# Patient Record
Sex: Female | Born: 1965 | Race: White | Hispanic: No | Marital: Married | Smoking: Never smoker
Health system: Southern US, Community
[De-identification: ages and names within clinical notes are randomized; demographics above are authoritative.]

## PROBLEM LIST (undated history)

## (undated) DIAGNOSIS — R42 Dizziness and giddiness: Secondary | ICD-10-CM

## (undated) DIAGNOSIS — I10 Essential (primary) hypertension: Secondary | ICD-10-CM

## (undated) HISTORY — PX: COLONOSCOPY: SHX174

## (undated) HISTORY — DX: Essential (primary) hypertension: I10

## (undated) HISTORY — DX: Dizziness and giddiness: R42

## (undated) HISTORY — PX: HYSTEROSCOPY: SHX211

---

## 1997-08-23 ENCOUNTER — Other Ambulatory Visit: Admission: RE | Admit: 1997-08-23 | Discharge: 1997-08-23 | Payer: Self-pay | Admitting: Obstetrics and Gynecology

## 1999-08-25 ENCOUNTER — Other Ambulatory Visit: Admission: RE | Admit: 1999-08-25 | Discharge: 1999-08-25 | Payer: Self-pay | Admitting: Obstetrics and Gynecology

## 2000-02-26 ENCOUNTER — Other Ambulatory Visit: Admission: RE | Admit: 2000-02-26 | Discharge: 2000-02-26 | Payer: Self-pay | Admitting: Obstetrics and Gynecology

## 2000-07-13 ENCOUNTER — Encounter: Admission: RE | Admit: 2000-07-13 | Discharge: 2000-07-13 | Payer: Self-pay | Admitting: Obstetrics and Gynecology

## 2000-09-24 ENCOUNTER — Inpatient Hospital Stay (HOSPITAL_COMMUNITY): Admission: AD | Admit: 2000-09-24 | Discharge: 2000-09-27 | Payer: Self-pay | Admitting: Obstetrics and Gynecology

## 2000-11-02 ENCOUNTER — Other Ambulatory Visit: Admission: RE | Admit: 2000-11-02 | Discharge: 2000-11-02 | Payer: Self-pay | Admitting: Obstetrics and Gynecology

## 2002-07-24 ENCOUNTER — Other Ambulatory Visit: Admission: RE | Admit: 2002-07-24 | Discharge: 2002-07-24 | Payer: Self-pay | Admitting: Obstetrics and Gynecology

## 2003-02-08 ENCOUNTER — Inpatient Hospital Stay (HOSPITAL_COMMUNITY): Admission: AD | Admit: 2003-02-08 | Discharge: 2003-02-11 | Payer: Self-pay | Admitting: Obstetrics and Gynecology

## 2003-03-20 ENCOUNTER — Other Ambulatory Visit: Admission: RE | Admit: 2003-03-20 | Discharge: 2003-03-20 | Payer: Self-pay | Admitting: Obstetrics and Gynecology

## 2004-09-23 ENCOUNTER — Other Ambulatory Visit: Admission: RE | Admit: 2004-09-23 | Discharge: 2004-09-23 | Payer: Self-pay | Admitting: Obstetrics and Gynecology

## 2011-03-03 ENCOUNTER — Emergency Department (HOSPITAL_COMMUNITY)
Admission: EM | Admit: 2011-03-03 | Discharge: 2011-03-03 | Disposition: A | Discharge status: Home / Self Care | Payer: BC Managed Care – PPO | Attending: Emergency Medicine | Admitting: Emergency Medicine

## 2011-03-03 ENCOUNTER — Emergency Department (HOSPITAL_COMMUNITY): Payer: BC Managed Care – PPO

## 2011-03-03 ENCOUNTER — Encounter (HOSPITAL_COMMUNITY): Payer: Self-pay | Admitting: Emergency Medicine

## 2011-03-03 DIAGNOSIS — S93402A Sprain of unspecified ligament of left ankle, initial encounter: Secondary | ICD-10-CM

## 2011-03-03 DIAGNOSIS — M25473 Effusion, unspecified ankle: Secondary | ICD-10-CM | POA: Insufficient documentation

## 2011-03-03 DIAGNOSIS — R269 Unspecified abnormalities of gait and mobility: Secondary | ICD-10-CM | POA: Insufficient documentation

## 2011-03-03 DIAGNOSIS — M25476 Effusion, unspecified foot: Secondary | ICD-10-CM | POA: Insufficient documentation

## 2011-03-03 DIAGNOSIS — S9000XA Contusion of unspecified ankle, initial encounter: Secondary | ICD-10-CM | POA: Insufficient documentation

## 2011-03-03 DIAGNOSIS — M25579 Pain in unspecified ankle and joints of unspecified foot: Secondary | ICD-10-CM | POA: Insufficient documentation

## 2011-03-03 DIAGNOSIS — S93409A Sprain of unspecified ligament of unspecified ankle, initial encounter: Secondary | ICD-10-CM | POA: Insufficient documentation

## 2011-03-03 DIAGNOSIS — X500XXA Overexertion from strenuous movement or load, initial encounter: Secondary | ICD-10-CM | POA: Insufficient documentation

## 2011-03-03 MED ORDER — IBUPROFEN 600 MG PO TABS: 600.0000 mg | ORAL_TABLET | Freq: Four times a day (QID) | ORAL | Status: AC | PRN

## 2011-03-03 MED ORDER — HYDROCODONE-ACETAMINOPHEN 5-325 MG PO TABS: 1.0000 | ORAL_TABLET | ORAL | Status: AC | PRN

## 2011-03-03 MED ORDER — IBUPROFEN 200 MG PO TABS
400.0000 mg | ORAL_TABLET | Freq: Once | ORAL | Status: AC
  Administered 2011-03-03: 400 mg via ORAL
  Filled 2011-03-03: qty 2

## 2011-03-03 MED ORDER — HYDROCODONE-ACETAMINOPHEN 5-325 MG PO TABS
1.0000 | ORAL_TABLET | Freq: Once | ORAL | Status: AC
  Administered 2011-03-03: 1 via ORAL
  Filled 2011-03-03: qty 1

## 2011-03-03 NOTE — ED Provider Notes (Signed)
History     CSN: [id number]  Arrival date & time 03/03/11  4782   First MD Initiated Contact with Patient 03/03/11 [phone]      Chief Complaint  Patient presents with  . Ankle Pain    L ankle pain secondary to fall    (Consider location/radiation/quality/duration/timing/severity/associated sxs/prior treatment) Patient is a 46 y.o. female presenting with ankle pain. The history is provided by the patient.  Ankle Pain  Incident onset: 7:30 AM. The incident occurred at home. Injury mechanism: Missed the step and had an inversion injury of the left ankle. Pain location: Ankle. The quality of the pain is described as aching. The pain is mild. The pain has been improving since onset. Associated symptoms include inability to bear weight and loss of motion. Pertinent negatives include no numbness, no muscle weakness, no loss of sensation and no tingling. The symptoms are aggravated by palpation and bearing weight. She has tried NSAIDs, elevation, immobilization and ice for the symptoms. The treatment provided moderate relief.    No past medical history on file.  No past surgical history on file.  No family history on file.  History  Substance Use Topics  . Smoking status: Never Smoker   . Smokeless tobacco: Not on file  . Alcohol Use: No    Review of Systems  Constitutional: Negative for fever and chills.  Musculoskeletal: Positive for joint swelling and gait problem.  Skin: Negative for color change and wound.  Neurological: Negative for tingling, weakness and numbness.    Allergies  Review of patient's allergies indicates no known allergies.  Home Medications   Current Outpatient Rx  Name Route Sig Dispense Refill  . CALCIUM CARBONATE 1250 MG PO CHEW Oral Chew 2 tablets by mouth daily. STOMACH DISTRESS    . IBUPROFEN 200 MG PO TABS Oral Take 400 mg by mouth every 6 (six) hours as needed. FOR PAIN    . METRONIDAZOLE 0.75 % EX GEL Topical Apply 1 application topically 2 (two)  times daily. FOR ROSACEA    . ADULT MULTIVITAMIN W/MINERALS CH Oral Take 1 tablet by mouth daily.      BP 136/78  Pulse 84  Temp(Src) 98.2 F (36.8 C) (Oral)  Resp 20  Ht 5\' 6"  (1.676 m)  Wt 175 lb (79.379 kg)  BMI 28.25 kg/m2  SpO2 99%  Physical Exam  Nursing note and vitals reviewed. Constitutional: She is oriented to person, place, and time. She appears well-developed and well-nourished. No distress.  HENT:  Head: Normocephalic and atraumatic.  Right Ear: External ear normal.  Left Ear: External ear normal.  Neck: Neck supple.  Cardiovascular: Normal rate and regular rhythm.   Pulmonary/Chest: Effort normal. No respiratory distress.  Abdominal: Soft. She exhibits no distension.  Musculoskeletal:       Left ankle: She exhibits decreased range of motion, swelling and ecchymosis. She exhibits no deformity, no laceration and normal pulse. tenderness. AITFL and CF ligament tenderness found. No proximal fibula tenderness found.       Feet:       Slightly decreased plantar flexion. Dorsi/plantar flexion strength 5/5 bilaterally  Neurological: She is alert and oriented to person, place, and time.       Sensation intact to light touch  Skin: Skin is warm and dry.    ED Course  Procedures (including critical care time)  Labs Reviewed - No data to display Dg Ankle Complete Left  03/03/2011  *RADIOLOGY REPORT*  Clinical Data: Pain post fall  LEFT ANKLE  COMPLETE - 3+ VIEW  Comparison: None.  Findings: Three views of the left ankle submitted.  No acute fracture or subluxation.  Ankle mortise is preserved.  Soft tissue swelling noted adjacent to lateral malleolus.  There is plantar spur of the calcaneus.  IMPRESSION: No acute fracture or subluxation.  Soft tissue swelling adjacent to lateral malleolus.  Plantar spur of the calcaneus.  Original Report Authenticated By: Natasha Mead, M.D.        MDM  X-ray reviewed, no sign of fx or dislocation. Distal neurovascular status intact.  Ankle sprain- ASO and crutches ordered. Pt given instructions on R.I.C.E. and gentle stretching to be performed in a few days.         Shaaron Adler, [place] 03/03/11 1215  Shaaron Adler, [place] 03/03/11 1218

## 2011-03-03 NOTE — Progress Notes (Signed)
Orthopedic Tech Progress Note Patient Details:  Whitney Gutierrez [birth year] [id number]  Other Ortho Devices Type of Ortho Device: ASO;Crutches   Cammer, Mickie Bail 03/03/2011, 12:05 PM

## 2011-03-03 NOTE — ED Notes (Signed)
Patient resting quietly, calm with unlabored respirations.  Husband at bedside.

## 2011-03-03 NOTE — ED Notes (Signed)
Patient asked for pain medication.  Huntley Dec, PA.

## 2011-03-03 NOTE — ED Notes (Signed)
Patient returned from radiology by stretcher with tech.

## 2011-03-03 NOTE — ED Notes (Signed)
Wheelchair to discharge.

## 2011-03-03 NOTE — ED Notes (Addendum)
Patient resting quietly, calm with unlabored respirations.  

## 2011-03-03 NOTE — ED Notes (Signed)
Patient was getting ready to take son to school when she tripped down some steps.  Patient injured her L ankle.  Ankle has redness and swelling per EMS, but no deformity.  Per EMS, patient took 2 x Ibuprofen at her residence PTA.

## 2011-03-03 NOTE — ED Notes (Signed)
Patient taken to radiology by stretcher with tech.  

## 2011-03-03 NOTE — ED Notes (Signed)
Ortho tech at the bedside placing splint and performing teaching regarding use of crutches. Pt tolerating without difficulty.

## 2011-03-06 NOTE — ED Provider Notes (Signed)
History/physical exam/procedure(s) were performed by non-physician practitioner and as supervising physician I was immediately available for consultation/collaboration. I have reviewed all notes and am in agreement with care and plan.   Hilario Quarry, MD 03/06/11 [phone]

## 2013-07-28 IMAGING — CR DG ANKLE COMPLETE 3+V*L*
3 series · 3 of 3 positions shown · non-contrast
Comparison: None.

CLINICAL DATA: Pain post fall

LEFT ANKLE COMPLETE - 3+ VIEW

[t ankle joint ap left]
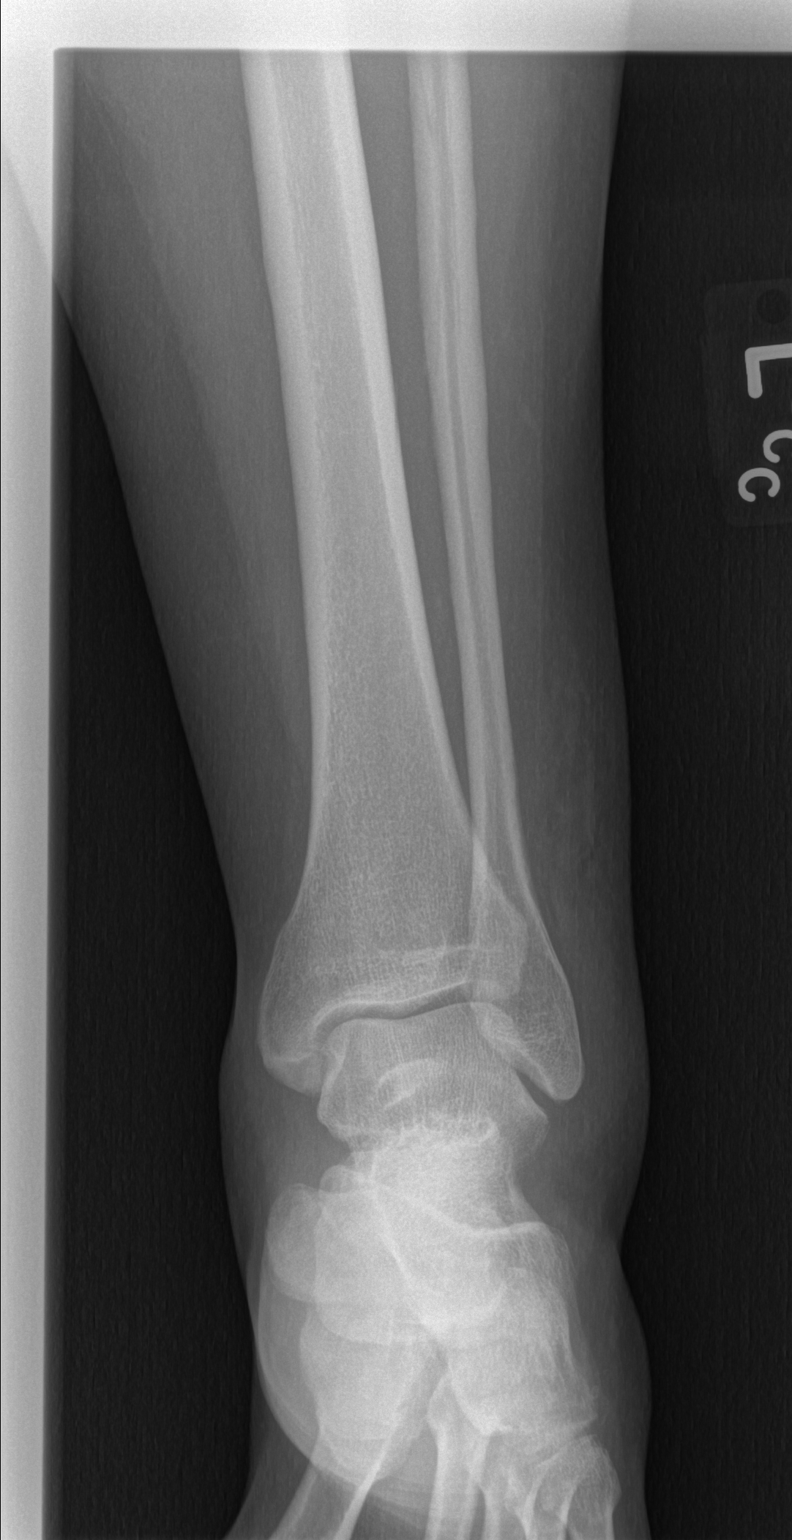

[t ankle joint oblique left]
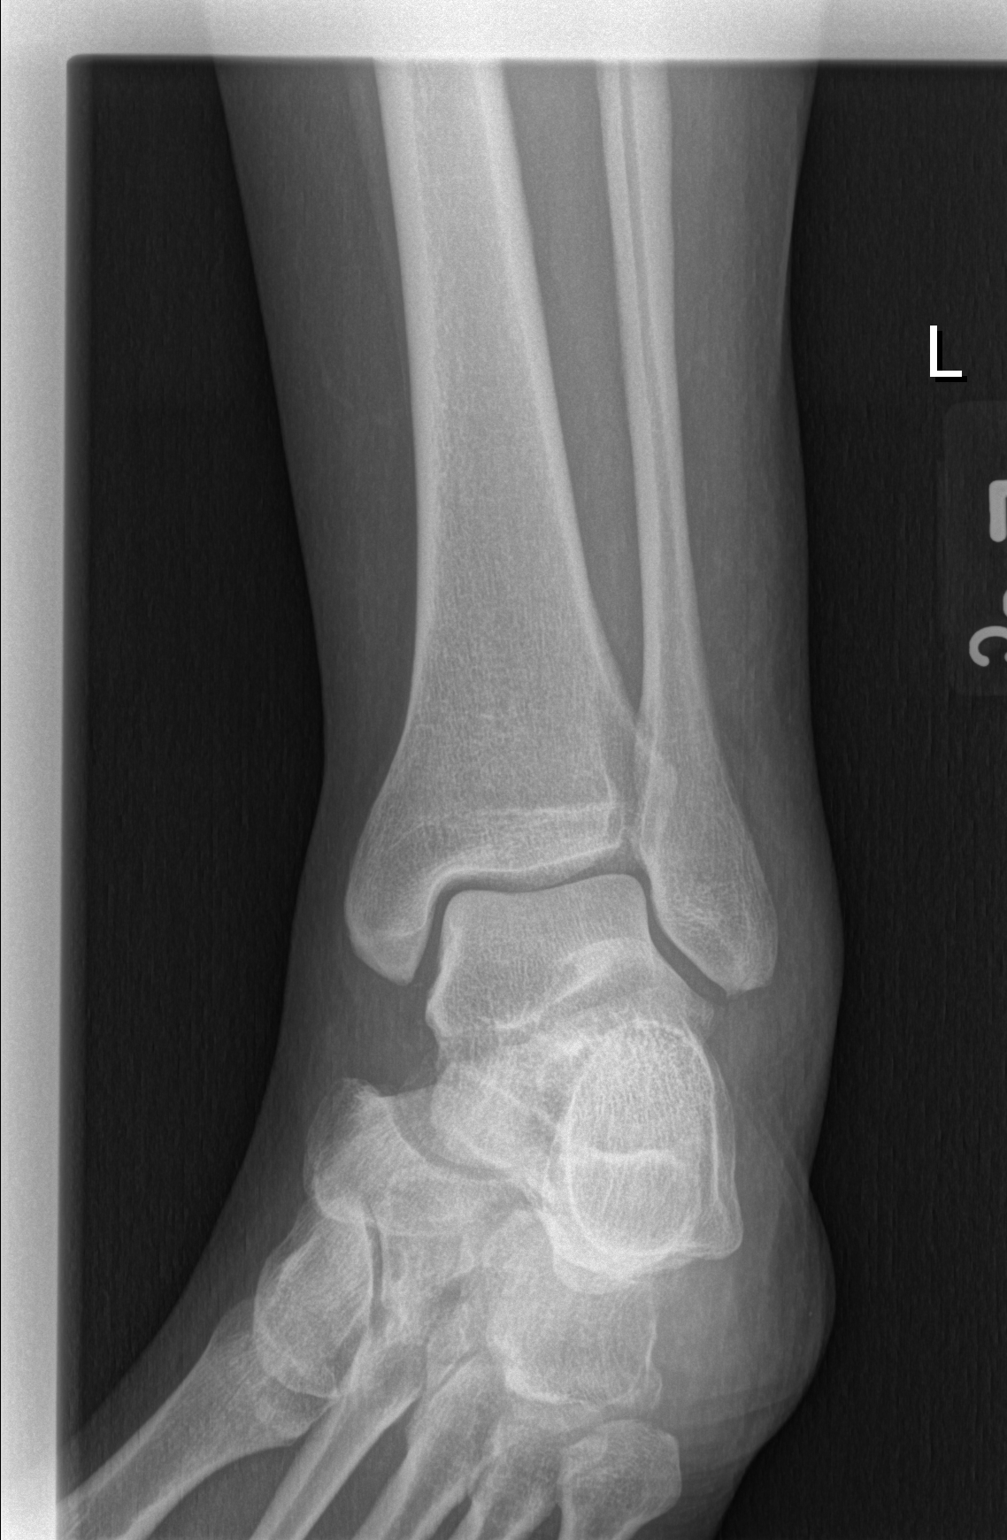

[t ankle joint lat left]
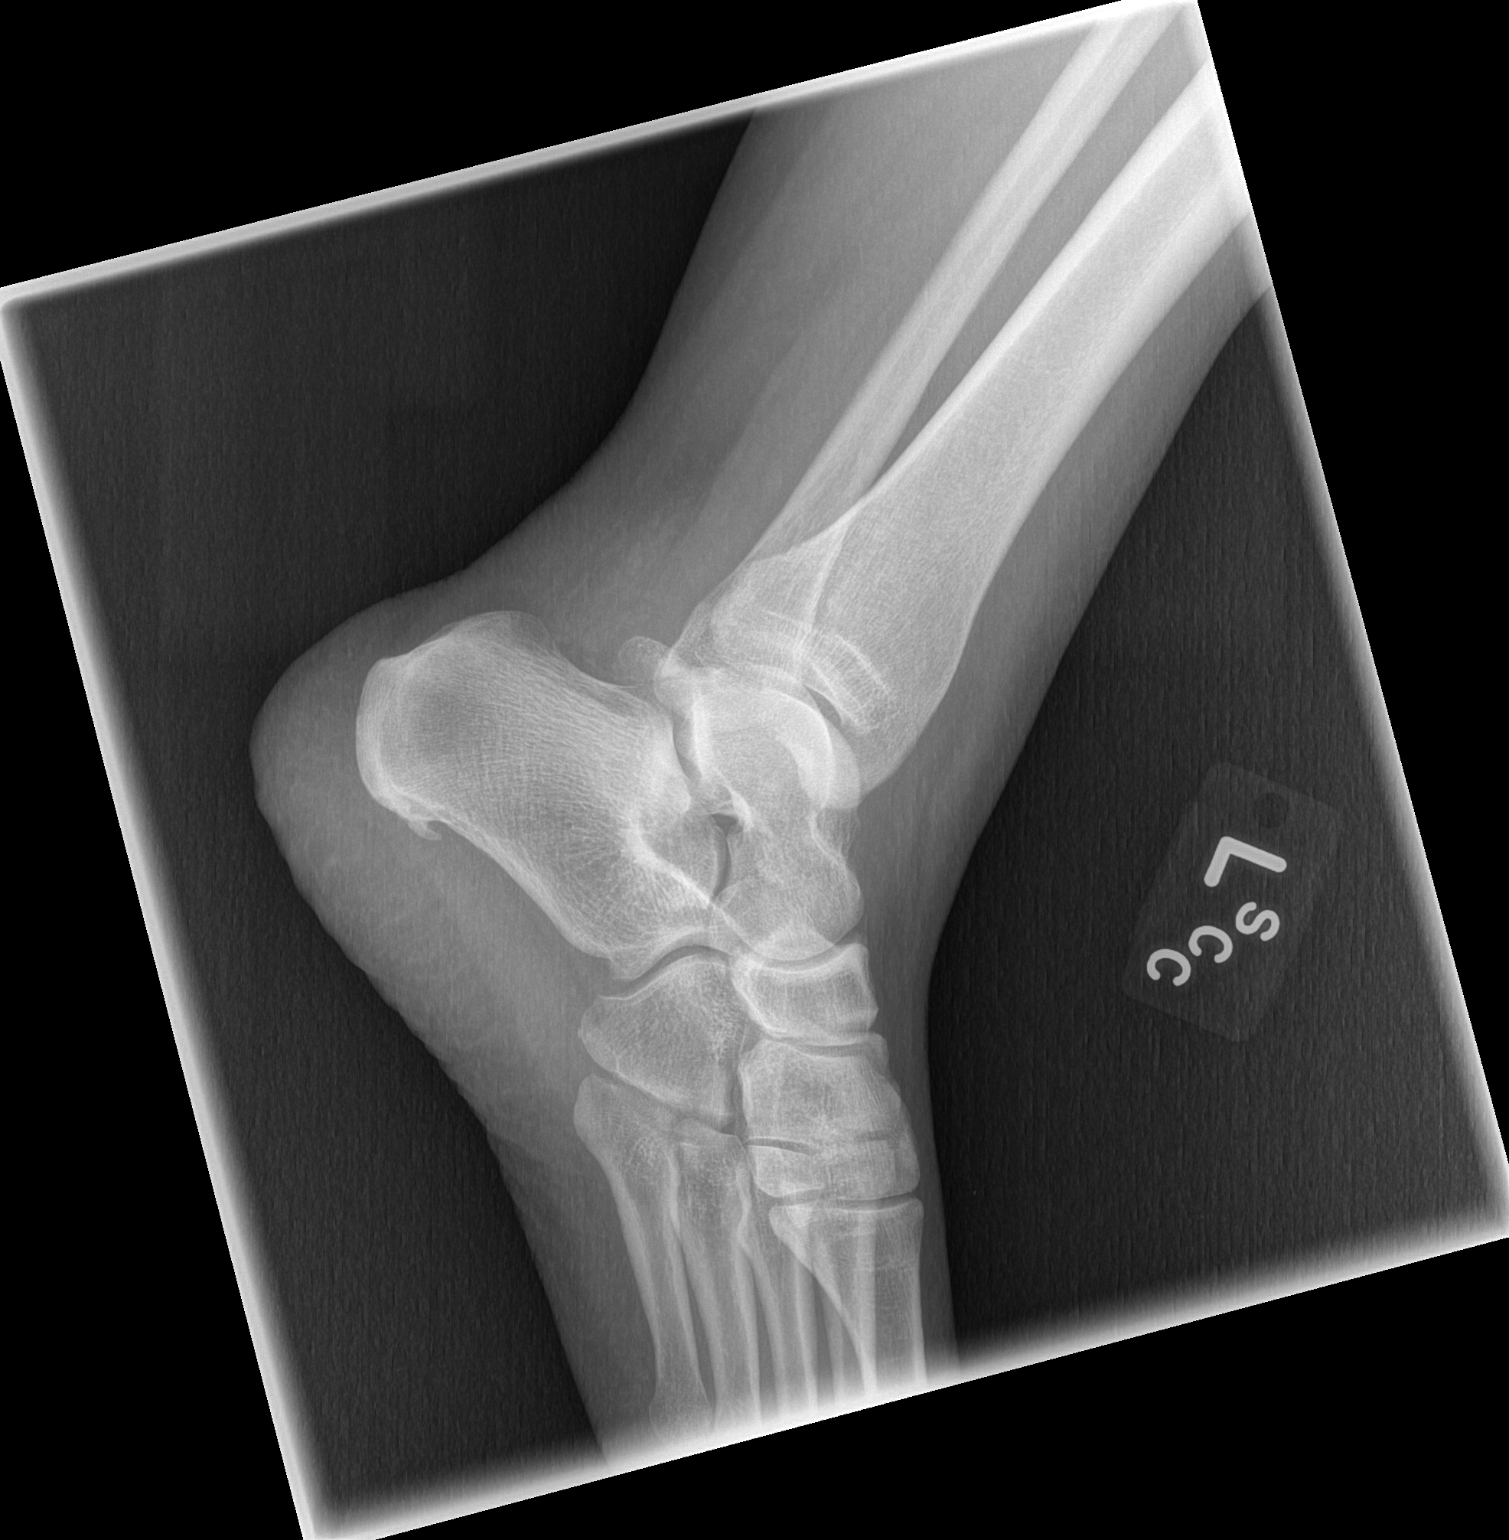

[3 of 3 positions shown; findings below may reference images not displayed]

FINDINGS: Three views of the left ankle submitted.  No acute
fracture or subluxation.  Ankle mortise is preserved.  Soft tissue
swelling noted adjacent to lateral malleolus.  There is plantar
spur of the calcaneus.
IMPRESSION: No acute fracture or subluxation.  Soft tissue swelling adjacent to
lateral malleolus.  Plantar spur of the calcaneus.

## 2015-11-08 ENCOUNTER — Encounter: Payer: Self-pay | Admitting: Gastroenterology

## 2015-12-31 ENCOUNTER — Encounter: Payer: Self-pay | Admitting: Obstetrics and Gynecology

## 2016-01-24 ENCOUNTER — Encounter: Payer: BC Managed Care – PPO | Admitting: Gastroenterology

## 2019-04-13 ENCOUNTER — Ambulatory Visit: Payer: BC Managed Care – PPO | Attending: Internal Medicine

## 2019-04-13 DIAGNOSIS — Z23 Encounter for immunization: Secondary | ICD-10-CM

## 2019-04-13 NOTE — Progress Notes (Signed)
   Covid-19 Vaccination Clinic  Name:  Whitney Gutierrez    MRN: [id number] DOB: 1965/06/25  04/13/2019  [name] was observed post Covid-19 immunization for 15 minutes without incidence. She was provided with Vaccine Information Sheet and instruction to access the V-Safe system.   Whitney Gutierrez was instructed to call 911 with any severe reactions post vaccine: Marland Kitchen Difficulty breathing  . Swelling of your face and throat  . A fast heartbeat  . A bad rash all over your body  . Dizziness and weakness    Immunizations Administered    Name Date Dose VIS Date Route   Pfizer COVID-19 Vaccine 04/13/2019 11:30 AM 0.3 mL 01/27/2019 Intramuscular   Manufacturer: [place]   Lot: [id number]   [place]: [id number]

## 2019-05-10 ENCOUNTER — Ambulatory Visit: Payer: BC Managed Care – PPO | Attending: Internal Medicine

## 2019-05-10 DIAGNOSIS — Z23 Encounter for immunization: Secondary | ICD-10-CM

## 2019-05-10 NOTE — Progress Notes (Signed)
   Covid-19 Vaccination Clinic  Name:  Whitney Gutierrez    MRN: [id number] DOB: October 03, 1965  05/10/2019  [name] was observed post Covid-19 immunization for 15 minutes without incident. She was provided with Vaccine Information Sheet and instruction to access the V-Safe system.   Ms. Trabue was instructed to call 911 with any severe reactions post vaccine: Marland Kitchen Difficulty breathing  . Swelling of face and throat  . A fast heartbeat  . A bad rash all over body  . Dizziness and weakness   Immunizations Administered    Name Date Dose VIS Date Route   Pfizer COVID-19 Vaccine 05/10/2019 12:34 PM 0.3 mL 01/27/2019 Intramuscular   Manufacturer: [place]   Lot: [id number]   [place]: [id number]

## 2019-08-14 ENCOUNTER — Encounter: Payer: Self-pay | Admitting: Gastroenterology

## 2019-10-02 ENCOUNTER — Encounter: Payer: Self-pay | Admitting: Gastroenterology

## 2019-10-02 ENCOUNTER — Ambulatory Visit (AMBULATORY_SURGERY_CENTER): Payer: Self-pay

## 2019-10-02 ENCOUNTER — Other Ambulatory Visit: Payer: Self-pay

## 2019-10-02 VITALS — Ht 66.0 in | Wt 196.0 lb

## 2019-10-02 DIAGNOSIS — Z1211 Encounter for screening for malignant neoplasm of colon: Secondary | ICD-10-CM

## 2019-10-02 MED ORDER — SUTAB 1479-225-188 MG PO TABS: 12.0000 | ORAL_TABLET | ORAL | 0 refills | Status: DC

## 2019-10-02 NOTE — Progress Notes (Signed)
No egg or soy allergy known to patient  No issues with past sedation with any surgeries or procedures no intubation problems in the past  No FH of Malignant Hyperthermia No diet pills per patient No home 02 use per patient  No blood thinners per patient  Pt denies issues with constipation  No A fib or A flutter  EMMI video to pt or via [place] 19 guidelines implemented in PV today with Pt and RN   SuTab Coupon given to pt in PV today , Code to Pharmacy   Pt has been vaccinated for covid.  Due to the COVID-19 pandemic we are asking patients to follow these guidelines. Please only bring one care partner. Please be aware that your care partner may wait in the car in the parking lot or if they feel like they will be too hot to wait in the car, they may wait in the lobby on the 4th floor. All care partners are required to wear a mask the entire time (we do not have any that we can provide them), they need to practice social distancing, and we will do a Covid check for all patient's and care partners when you arrive. Also we will check their temperature and your temperature. If the care partner waits in their car they need to stay in the parking lot the entire time and we will call them on their cell phone when the patient is ready for discharge so they can bring the car to the front of the building. Also all patient's will need to wear a mask into building.

## 2019-10-16 ENCOUNTER — Other Ambulatory Visit: Payer: Self-pay

## 2019-10-16 ENCOUNTER — Encounter: Payer: Self-pay | Admitting: Gastroenterology

## 2019-10-16 ENCOUNTER — Ambulatory Visit (AMBULATORY_SURGERY_CENTER): Payer: BC Managed Care – PPO | Admitting: Gastroenterology

## 2019-10-16 VITALS — BP 129/60 | HR 72 | Temp 97.5°F | Resp 14 | Ht 66.0 in | Wt 196.0 lb

## 2019-10-16 DIAGNOSIS — D124 Benign neoplasm of descending colon: Secondary | ICD-10-CM

## 2019-10-16 DIAGNOSIS — K635 Polyp of colon: Secondary | ICD-10-CM

## 2019-10-16 DIAGNOSIS — Z1211 Encounter for screening for malignant neoplasm of colon: Secondary | ICD-10-CM | POA: Diagnosis not present

## 2019-10-16 DIAGNOSIS — D127 Benign neoplasm of rectosigmoid junction: Secondary | ICD-10-CM

## 2019-10-16 MED ORDER — SODIUM CHLORIDE 0.9 % IV SOLN: 500.0000 mL | Freq: Once | INTRAVENOUS | Status: DC

## 2019-10-16 NOTE — Patient Instructions (Signed)
Handouts on polyps & diverticulosis given to you today   Await pathology results on polyps removed today   YOU HAD AN ENDOSCOPIC PROCEDURE TODAY AT [facility]:   Refer to the procedure report that was given to you for any specific questions about what was found during the examination.  If the procedure report does not answer your questions, please call your gastroenterologist to clarify.  If you requested that your care partner not be given the details of your procedure findings, then the procedure report has been included in a sealed envelope for you to review at your convenience later.  YOU SHOULD EXPECT: Some feelings of bloating in the abdomen. Passage of more gas than usual.  Walking can help get rid of the air that was put into your GI tract during the procedure and reduce the bloating. If you had a lower endoscopy (such as a colonoscopy or flexible sigmoidoscopy) you may notice spotting of blood in your stool or on the toilet paper. If you underwent a bowel prep for your procedure, you may not have a normal bowel movement for a few days.  Please Note:  You might notice some irritation and congestion in your nose or some drainage.  This is from the oxygen used during your procedure.  There is no need for concern and it should clear up in a day or so.  SYMPTOMS TO REPORT IMMEDIATELY:   Following lower endoscopy (colonoscopy or flexible sigmoidoscopy):  Excessive amounts of blood in the stool  Significant tenderness or worsening of abdominal pains  Swelling of the abdomen that is new, acute  Fever of 100F or higher    For urgent or emergent issues, a gastroenterologist can be reached at any hour by calling [phone]. Do not use MyChart messaging for urgent concerns.    DIET:  We do recommend a small meal at first, but then you may proceed to your regular diet.  Drink plenty of fluids but you should avoid alcoholic beverages for 24 hours.  ACTIVITY:  You  should plan to take it easy for the rest of today and you should NOT DRIVE or use heavy machinery until tomorrow (because of the sedation medicines used during the test).    FOLLOW UP: Our staff will call the number listed on your records 48-72 hours following your procedure to check on you and address any questions or concerns that you may have regarding the information given to you following your procedure. If we do not reach you, we will leave a message.  We will attempt to reach you two times.  During this call, we will ask if you have developed any symptoms of COVID 19. If you develop any symptoms (ie: fever, flu-like symptoms, shortness of breath, cough etc.) before then, please call [phone].  If you test positive for Covid 19 in the 2 weeks post procedure, please call and report this information to [place].    If any biopsies were taken you will be contacted by phone or by letter within the next 1-3 weeks.  Please call us at [phone] if you have not heard about the biopsies in 3 weeks.    SIGNATURES/CONFIDENTIALITY: You and/or your care partner have signed paperwork which will be entered into your electronic medical record.  These signatures attest to the fact that that the information above on your After Visit Summary has been reviewed and is understood.  Full responsibility of the confidentiality of this discharge information lies with  you and/or your care-partner.

## 2019-10-16 NOTE — Op Note (Signed)
[facility] Patient Name: Whitney Gutierrez Procedure Date: 10/16/2019 2:30 PM MRN: [id number] Endoscopist: Remo Lipps P. Havery Moros , MD Age: 54 Referring MD:  Date of Birth: 01/05/1966 Gender: Female Account #: [id number] Procedure:                Colonoscopy Indications:              Screening for colorectal malignant neoplasm, This                            is the patient's first colonoscopy Medicines:                Monitored Anesthesia Care Procedure:                Pre-Anesthesia Assessment:                           - Prior to the procedure, a History and Physical                            was performed, and patient medications and                            allergies were reviewed. The patient's tolerance of                            previous anesthesia was also reviewed. The risks                            and benefits of the procedure and the sedation                            options and risks were discussed with the patient.                            All questions were answered, and informed consent                            was obtained. Prior Anticoagulants: The patient has                            taken no previous anticoagulant or antiplatelet                            agents. ASA Grade Assessment: II - A patient with                            mild systemic disease. After reviewing the risks                            and benefits, the patient was deemed in                            satisfactory condition to undergo the procedure.  After obtaining informed consent, the colonoscope                            was passed under direct vision. Throughout the                            procedure, the patient's blood pressure, pulse, and                            oxygen saturations were monitored continuously. The                            Colonoscope was introduced through the anus and                            advanced to  the the cecum, identified by                            appendiceal orifice and ileocecal valve. The                            colonoscopy was performed without difficulty. The                            patient tolerated the procedure well. The quality                            of the bowel preparation was good. The ileocecal                            valve, appendiceal orifice, and rectum were                            photographed. Scope In: 2:41:18 PM Scope Out: 3:00:03 PM Scope Withdrawal Time: 0 hours 16 minutes 32 seconds  Total Procedure Duration: 0 hours 18 minutes 45 seconds  Findings:                 The perianal and digital rectal examinations were                            normal.                           A 3 mm polyp was found in the descending colon. The                            polyp was sessile. The polyp was removed with a                            cold snare. Resection and retrieval were complete.                           A 3 mm polyp was found in the recto-sigmoid colon.  The polyp was sessile. The polyp was removed with a                            cold snare. Resection and retrieval were complete.                           A few small-mouthed diverticula were found in the                            left colon.                           The exam was otherwise without abnormality. Complications:            No immediate complications. Estimated blood loss:                            Minimal. Estimated Blood Loss:     Estimated blood loss was minimal. Impression:               - One 3 mm polyp in the descending colon, removed                            with a cold snare. Resected and retrieved.                           - One 3 mm polyp at the recto-sigmoid colon,                            removed with a cold snare. Resected and retrieved.                           - Diverticulosis in the left colon.                           - The  examination was otherwise normal. Recommendation:           - Patient has a contact number available for                            emergencies. The signs and symptoms of potential                            delayed complications were discussed with the                            patient. Return to normal activities tomorrow.                            Written discharge instructions were provided to the                            patient.                           - Resume  previous diet.                           - Continue present medications.                           - Await pathology results. Remo Lipps P. Havery Moros, MD 10/16/2019 3:02:44 PM This report has been signed electronically.

## 2019-10-16 NOTE — Progress Notes (Signed)
Pt's states no medical or surgical changes since previsit or office visit.  ° °VS AG °

## 2019-10-16 NOTE — Progress Notes (Signed)
Called to room to assist during endoscopic procedure.  Patient ID and intended procedure confirmed with present staff. Received instructions for my participation in the procedure from the performing physician.  

## 2019-10-16 NOTE — Progress Notes (Signed)
To PACU, VSS. Report to [email or web address] 

## 2019-10-18 ENCOUNTER — Telehealth: Payer: Self-pay

## 2019-10-18 NOTE — Telephone Encounter (Signed)
  Follow up Call-  Call back number 10/16/2019  Post procedure Call Back phone  # [phone]  Permission to leave phone message Yes  Some recent data might be hidden     Patient questions:  Do you have a fever, pain , or abdominal swelling? No. Pain Score  0 *  Have you tolerated food without any problems? Yes.    Have you been able to return to your normal activities? Yes.    Do you have any questions about your discharge instructions: Diet   No. Medications  No. Follow up visit  No.  Do you have questions or concerns about your Care? No.  Actions: * If pain score is 4 or above: No action needed, pain <4.  1. Have you developed a fever since your procedure? No  2.   Have you had an respiratory symptoms (SOB or cough) since your procedure? No  3.   Have you tested positive for COVID 19 since your procedure No  4.   Have you had any family members/close contacts diagnosed with the COVID 19 since your procedure?  No  If yes to any of these questions please route to Joylene John, RN and Joella Prince, RN

## 2019-10-21 ENCOUNTER — Encounter: Payer: Self-pay | Admitting: Gastroenterology

## 2021-01-30 ENCOUNTER — Telehealth: Payer: Self-pay | Admitting: *Deleted

## 2021-01-30 NOTE — Telephone Encounter (Signed)
Patient called the office and scheduled her new patient appt with Dr Berline Lopes on 12/23 at 10:30 am; patient given an arrival time of 10 am. Patient aware of the clinic address and phone number.

## 2021-01-30 NOTE — Telephone Encounter (Signed)
Called and left the patient a message to call the office back to schedule a new patient appt with Dr Tucker  

## 2021-02-06 ENCOUNTER — Encounter: Payer: Self-pay | Admitting: Gynecologic Oncology

## 2021-02-07 ENCOUNTER — Inpatient Hospital Stay: Payer: BC Managed Care – PPO | Attending: Gynecologic Oncology | Admitting: Gynecologic Oncology

## 2021-02-07 ENCOUNTER — Encounter: Payer: Self-pay | Admitting: Gynecologic Oncology

## 2021-02-07 ENCOUNTER — Other Ambulatory Visit: Payer: Self-pay | Admitting: Gynecologic Oncology

## 2021-02-07 ENCOUNTER — Other Ambulatory Visit: Payer: Self-pay

## 2021-02-07 ENCOUNTER — Inpatient Hospital Stay (HOSPITAL_BASED_OUTPATIENT_CLINIC_OR_DEPARTMENT_OTHER): Payer: BC Managed Care – PPO | Admitting: Gynecologic Oncology

## 2021-02-07 VITALS — BP 152/88 | HR 97 | Temp 98.2°F | Resp 18 | Ht 66.0 in | Wt 195.7 lb

## 2021-02-07 DIAGNOSIS — N8502 Endometrial intraepithelial neoplasia [EIN]: Secondary | ICD-10-CM

## 2021-02-07 DIAGNOSIS — Z6831 Body mass index (BMI) 31.0-31.9, adult: Secondary | ICD-10-CM | POA: Insufficient documentation

## 2021-02-07 MED ORDER — TRAMADOL HCL 50 MG PO TABS: 50.0000 mg | ORAL_TABLET | Freq: Four times a day (QID) | ORAL | 0 refills | Status: DC | PRN

## 2021-02-07 MED ORDER — IBUPROFEN 800 MG PO TABS: 800.0000 mg | ORAL_TABLET | Freq: Three times a day (TID) | ORAL | 0 refills | Status: DC | PRN

## 2021-02-07 MED ORDER — TRAMADOL HCL 50 MG PO TABS: 50.0000 mg | ORAL_TABLET | Freq: Four times a day (QID) | ORAL | 0 refills | Status: AC | PRN

## 2021-02-07 MED ORDER — SENNOSIDES-DOCUSATE SODIUM 8.6-50 MG PO TABS: 2.0000 | ORAL_TABLET | Freq: Every day | ORAL | 0 refills | Status: DC

## 2021-02-07 MED ORDER — IBUPROFEN 800 MG PO TABS: 800.0000 mg | ORAL_TABLET | Freq: Three times a day (TID) | ORAL | 0 refills | Status: AC | PRN

## 2021-02-07 MED ORDER — SENNOSIDES-DOCUSATE SODIUM 8.6-50 MG PO TABS: 2.0000 | ORAL_TABLET | Freq: Every day | ORAL | 0 refills | Status: AC

## 2021-02-07 NOTE — H&P (View-Only) (Signed)
GYNECOLOGIC ONCOLOGY NEW PATIENT CONSULTATION   Patient Name: Whitney Gutierrez  Patient Age: 55 y.o. 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  We reviewed the diagnosis of complex atypical hyperplasia (CAH) and the treatment options, including medical management (Mirena IUD or progesterone PO) or hysterectomy.    Given her age and that she is done with childbirth and healthy, I recommend surgical management.  Patient desires to proceed with surgical management.    The patient is a suitable candidate for hysterectomy via a minimally invasive approach to surgery.  Given that she is peri versus postmenopausal, a bilateral salpingo-oophorectomy is also recommended.  We reviewed that robotic assistance would be used to complete the surgery.  We discussed that endometrial cancer is detected in about 40% of final uterine pathology specimens from patients with CAH.  We talked about 2 plans.  One would be the standard of care which would be to plan to send the uterus for intraoperative frozen pathology.  If cancer is identified at the time of surgery, additional procedures including lymph node evaluation, is recommended.  The other option would be to plan for sentinel lymph node biopsy.  We discussed that this is standard of care in the setting of a cancer diagnosis.  Because of the high risk of ultimately diagnosing a cancer on hysterectomy for complex atypical hyperplasia, I offered this lymph node sampling procedure to patients with a biopsy of CAH preoperatively.  We discussed that if she were not to map, then we would default to sending the uterus to pathology for frozen section to guide whether lymph node removal was necessary.  We reviewed the  sentinel lymph node technique. Risks and benefits of sentinel lymph node biopsy was reviewed. We reviewed the technique and ICG dye.  We discussed the plan the patient DOES NOT have an iodine allergy or known liver dysfunction. We reviewed the false negative rate (0.4%), and that 3% of patients with metastatic disease will not have it detected by SLN biopsy in endometrial cancer. A low risk of allergic reaction to the dye, <0.2% for ICG, has been reported. We also discussed that in the case of failed mapping, which occurs 40% of the time, a bilateral or unilateral lymphadenectomy will be performed at the surgeons discretion.   Potential benefits of sentinel nodes including a higher detection rate for metastasis due to ultrastaging and potential reduction in operative morbidity. However, there remains uncertainty as to the role for treatment of micrometastatic disease. Further, the benefit of operative morbidity associated with the SLN technique in endometrial cancer is not yet completely known. In other patient populations (e.g. the cervical cancer population) there has been observed reductions in morbidity with SLN biopsy compared to pelvic lymphadenectomy. Lymphedema, nerve dysfunction and lymphocysts are all potential risks with the SLN technique as with complete lymphadenectomy. Additional risks to the patient include the risk of damage to an internal organ while operating in an altered view (e.g. the black and white image of the robotic fluorescence imaging mode).   The patient was consented for a robotic assisted hysterectomy, bilateral salpingo-oophorectomy, sentinel lymph node evaluation, possible lymph node dissection, possible laparotomy. The risks of surgery were discussed in detail and she understands these to include infection; wound separation; hernia; vaginal cuff separation, injury to adjacent organs  such as bowel, bladder, blood vessels, ureters and nerves; bleeding which may require blood  transfusion; anesthesia risk; thromboembolic events; possible death; unforeseen complications; possible need for re-exploration; medical complications such as heart attack, stroke, pleural effusion and pneumonia; and, if full lymphadenectomy is performed the risk of lymphedema and lymphocyst. The patient will receive DVT and antibiotic prophylaxis as indicated. She voiced a clear understanding. She had the opportunity to ask questions. Perioperative instructions were reviewed with her. Prescriptions for post-op medications were sent to her pharmacy of choice.  We discussed the pathogenesis for endometrial precancer and cancer with regard to excess or unopposed estrogen exposure.  A copy of this note was sent to the patient's referring provider.   65 minutes of total time was spent for this patient encounter, including preparation, face-to-face counseling with the patient and coordination of care, and documentation of the encounter.   Jeral Pinch, MD  Division of Gynecologic Oncology  Department of Obstetrics and Gynecology  [facility]  ___________________________________________  Chief Complaint: Chief Complaint  Patient presents with   Complex atypical endometrial hyperplasia    History of Present Illness:  Whitney Gutierrez is a 55 y.o. y.o. female who is seen in consultation at the request of Everlene Farrier, MD for an evaluation of complex atypical hyperplasia.  Patient reports that last menses was in February 2022.  She had some minimal bleeding in July of this year although not enough to use a tampon.  She went to see her OB/GYN in September for checkup.  Given changes to her menses, hormones were ordered to see if she was menopausal.    Pelvic ultrasound was also ordered and this was performed on 10/10. It showed uterus measures 10 x 5.6 x 5 cm with slightly thickened inhomogenous myometrium, raising question about endometriosis.  Endometrial  thickness measures 1 cm.  Ovaries not well seen.  After saline sonohysterogram, 9 mm polypoid appearing mass seen within the endometrial cavity.  Endometrial curettings on 12/8 show complex atypical hyperplasia with squamous metaplasia.  There are few foci noted with back-to-back glandular crowding but not associated with stromal reaction.  Focal well differentiated adenocarcinoma cannot be ruled out, but changes noted to be predominantly complex atypical hyperplasia.  Patient denies any vaginal bleeding or discharge.  She denies any abdominal or pelvic pain.  She endorses good appetite without nausea or emesis.  She reports normal bowel and bladder function.  Has vertigo as well as whitecoat hypertension, denies any other medical issues.  Has never had abdominal surgery.  Family history is notable for father who was treated for stomach cancer 10 years ago [facility] at [facility].  Patient lives in [place] with her husband and 50 year old son.  Her 1 year old daughter is visiting from college.  Patient works as a Microbiologist.  PAST MEDICAL HISTORY:  Past Medical History:  Diagnosis Date   Vertigo    White coat syndrome with hypertension      PAST SURGICAL HISTORY:  Past Surgical History:  Procedure Laterality Date   COLONOSCOPY     HYSTEROSCOPY N/A     OB/GYN HISTORY:  OB History  Gravida Para Term Preterm AB Living  2 2          SAB IAB Ectopic Multiple Live Births               # Outcome Date GA Lbr Len/2nd Weight Sex Delivery Anes PTL Lv  2 Para  1 Para             No LMP recorded. (Menstrual status: Irregular Periods).  Age at menarche: 23 Age at menopause: 60 Hx of HRT: denies Hx of STDs: No Last pap: 10/2020-ASCUS, high risk HPV negative. History of abnormal pap smears: Noted above  SCREENING STUDIES:  Last mammogram: 10/2020  Last colonoscopy: 09/2019  MEDICATIONS: Outpatient Encounter Medications as of 02/07/2021   Medication Sig   ibuprofen (ADVIL,MOTRIN) 200 MG tablet Take 400 mg by mouth every 6 (six) hours as needed. FOR PAIN- as needed   Multiple Vitamin (MULITIVITAMIN WITH MINERALS) TABS Take 1 tablet by mouth daily.   No facility-administered encounter medications on file as of 02/07/2021.    ALLERGIES:  No Known Allergies   FAMILY HISTORY:  Family History  Problem Relation Age of Onset   Stomach cancer Father    Colon polyps Brother    Colon cancer Neg Hx    Esophageal cancer Neg Hx    Rectal cancer Neg Hx    Breast cancer Neg Hx    Ovarian cancer Neg Hx    Endometrial cancer Neg Hx    Pancreatic cancer Neg Hx    Prostate cancer Neg Hx      SOCIAL HISTORY:  Social Connections: Not on file    REVIEW OF SYSTEMS:  Denies appetite changes, fevers, chills, fatigue, unexplained weight changes. Denies hearing loss, neck lumps or masses, mouth sores, ringing in ears or voice changes. Denies cough or wheezing.  Denies shortness of breath. Denies chest pain or palpitations. Denies leg swelling. Denies abdominal distention, pain, blood in stools, constipation, diarrhea, nausea, vomiting, or early satiety. Denies pain with intercourse, dysuria, frequency, hematuria or incontinence. Denies hot flashes, pelvic pain, vaginal bleeding or vaginal discharge.   Denies joint pain, back pain or muscle pain/cramps. Denies itching, rash, or wounds. Denies dizziness, headaches, numbness or seizures. Denies swollen lymph nodes or glands, denies easy bruising or bleeding. Denies anxiety, depression, confusion, or decreased concentration.  Physical Exam:  Vital Signs for this encounter:  Blood pressure (!) 152/88, pulse 97, temperature 98.2 F (36.8 C), temperature source Tympanic, resp. rate 18, height 5\' 6"  (1.676 m), weight 195 lb 11.2 oz (88.8 kg), SpO2 99 %. Body mass index is 31.59 kg/m. General: Alert, oriented, no acute distress.  HEENT: Normocephalic, atraumatic. Sclera anicteric.   Chest: Clear to auscultation bilaterally. No wheezes, rhonchi, or rales. Cardiovascular: Regular rate and rhythm, no murmurs, rubs, or gallops.  Abdomen: Normoactive bowel sounds. Soft, nondistended, nontender to palpation. No masses or hepatosplenomegaly appreciated. No palpable fluid wave.  Extremities: Grossly normal range of motion. Warm, well perfused. No edema bilaterally.  Skin: No rashes or lesions.  Lymphatics: No cervical, supraclavicular, or inguinal adenopathy.  GU:  Normal external female genitalia. No lesions. No discharge or bleeding.             Bladder/urethra:  No lesions or masses, well supported bladder             Vagina: Well rugated, no lesions or masses.             Cervix: Normal appearing, no lesions.             Uterus: 8-10, mobile, no parametrial involvement or nodularity.             Adnexa: No masses appreciated.  Rectal: Deferred  LABORATORY AND RADIOLOGIC DATA:  Outside medical records were reviewed to synthesize the above history, along with the history  and physical obtained during the visit.   No results found for: WBC, HGB, HCT, PLT, GLUCOSE, CHOL, TRIG, HDL, LDLDIRECT, LDLCALC, ALT, AST, NA, K, CL, CREATININE, BUN, CO2, TSH, PSA, INR, GLUF, HGBA1C, MICROALBUR

## 2021-02-07 NOTE — Patient Instructions (Signed)
Preparing for your Surgery   Plan for surgery on February 25, 2021 with Dr. Jeral Pinch at [facility] will be scheduled for robotic assisted total laparoscopic hysterectomy (removal of the uterus and cervix), bilateral salpingo-oophorectomy (removal of both ovaries and fallopian tubes), sentinel lymph node biopsy, possible lymph node dissection, possible laparotomy (larger incision on your abdomen if needed).    Pre-operative Testing -You will receive a phone call from presurgical testing at [facility] to arrange for a pre-operative appointment and lab work.   -Bring your insurance card, copy of an advanced directive if applicable, medication list   -At that visit, you will be asked to sign a consent for a possible blood transfusion in case a transfusion becomes necessary during surgery.  The need for a blood transfusion is rare but having consent is a necessary part of your care.      -You should not be taking blood thinners or aspirin at least ten days prior to surgery unless instructed by your surgeon.   -Do not take supplements such as fish oil (omega 3), red yeast rice, turmeric before your surgery. You want to avoid medications with aspirin in them including headache powders such as BC or Goody's), Excedrin migraine.   Day Before Surgery at [place] will be asked to take in a light diet the day before surgery. You will be advised you can have clear liquids up until 3 hours before your surgery.     Eat a light diet the day before surgery.  Examples including soups, broths, toast, yogurt, mashed potatoes.  AVOID GAS PRODUCING FOODS. Things to avoid include carbonated beverages (fizzy beverages, sodas), raw fruits and raw vegetables (uncooked), or beans.    If your bowels are filled with gas, your surgeon will have difficulty visualizing your pelvic organs which increases your surgical risks.   Your role in recovery Your role is to become active as soon as  directed by your doctor, while still giving yourself time to heal.  Rest when you feel tired. You will be asked to do the following in order to speed your recovery:   - Cough and breathe deeply. This helps to clear and expand your lungs and can prevent pneumonia after surgery.  - [place]. Do mild physical activity. Walking or moving your legs help your circulation and body functions return to normal. Do not try to get up or walk alone the first time after surgery.   -If you develop swelling on one leg or the other, pain in the back of your leg, redness/warmth in one of your legs, please call the office or go to the Emergency Room to have a doppler to rule out a blood clot. For shortness of breath, chest pain-seek care in the Emergency Room as soon as possible. - Actively manage your pain. Managing your pain lets you move in comfort. We will ask you to rate your pain on a scale of zero to 10. It is your responsibility to tell your doctor or nurse where and how much you hurt so your pain can be treated.   Special Considerations -If you are diabetic, you may be placed on insulin after surgery to have closer control over your blood sugars to promote healing and recovery.  This does not mean that you will be discharged on insulin.  If applicable, your oral antidiabetics will be resumed when you are tolerating a solid diet.   -Your final pathology results from  surgery should be available around one week after surgery and the results will be relayed to you when available.   -Dr. Lahoma Crocker is the surgeon that assists your GYN Oncologist with surgery.  If you end up staying the night, the next day after your surgery you will either see Dr. Berline Lopes or Dr. Lahoma Crocker.   -FMLA forms can be faxed to [phone] and please allow 5-7 business days for completion.   Pain Management After Surgery -You have been prescribed your pain medication (tramadol) and bowel regimen  medications before surgery so that you can have these available when you are discharged from the hospital. The pain medication is for use ONLY AFTER surgery and a new prescription will not be given.    -Make sure that you have Tylenol and Ibuprofen at home to use on a regular basis after surgery for pain control. We recommend alternating the medications every hour to six hours since they work differently and are processed in the body differently for pain relief.   -Review the attached handout on narcotic use and their risks and side effects.    Bowel Regimen -You have been prescribed Sennakot-S to take nightly to prevent constipation especially if you are taking the narcotic pain medication intermittently.  It is important to prevent constipation and drink adequate amounts of liquids. You can stop taking this medication when you are not taking pain medication and you are back on your normal bowel routine.   Risks of Surgery Risks of surgery are low but include bleeding, infection, damage to surrounding structures, re-operation, blood clots, and very rarely death.     Blood Transfusion Information (For the consent to be signed before surgery)   We will be checking your blood type before surgery so in case of emergencies, we will know what type of blood you would need.                                             WHAT IS A BLOOD TRANSFUSION?   A transfusion is the replacement of blood or some of its parts. Blood is made up of multiple cells which provide different functions. Red blood cells carry oxygen and are used for blood loss replacement. White blood cells fight against infection. Platelets control bleeding. Plasma helps clot blood. Other blood products are available for specialized needs, such as hemophilia or other clotting disorders. BEFORE THE TRANSFUSION  Who gives blood for transfusions?  You may be able to donate blood to be used at a later date on yourself (autologous  donation). Relatives can be asked to donate blood. This is generally not any safer than if you have received blood from a stranger. The same precautions are taken to ensure safety when a relative's blood is donated. Healthy volunteers who are fully evaluated to make sure their blood is safe. This is blood bank blood. Transfusion therapy is the safest it has ever been in the practice of medicine. Before blood is taken from a donor, a complete history is taken to make sure that person has no history of diseases nor engages in risky social behavior (examples are intravenous drug use or sexual activity with multiple partners). The donor's travel history is screened to minimize risk of transmitting infections, such as malaria. The donated blood is tested for signs of infectious diseases, such as HIV and hepatitis. The blood is  then tested to be sure it is compatible with you in order to minimize the chance of a transfusion reaction. If you or a relative donates blood, this is often done in anticipation of surgery and is not appropriate for emergency situations. It takes many days to process the donated blood. RISKS AND COMPLICATIONS Although transfusion therapy is very safe and saves many lives, the main dangers of transfusion include:  Getting an infectious disease. Developing a transfusion reaction. This is an allergic reaction to something in the blood you were given. Every precaution is taken to prevent this. The decision to have a blood transfusion has been considered carefully by your caregiver before blood is given. Blood is not given unless the benefits outweigh the risks.   AFTER SURGERY INSTRUCTIONS   Return to work: 6 weeks if applicable   Activity: 1. Be up and out of the bed during the day.  Take a nap if needed.  You may walk up steps but be careful and use the hand rail.  Stair climbing will tire you more than you think, you may need to stop part way and rest.    2. No lifting or straining  for 6 weeks over 10 pounds. No pushing, pulling, straining for 6 weeks.   3. No driving for 1 week(s).  Do not drive if you are taking narcotic pain medicine and make sure that your reaction time has returned.    4. You can shower as soon as the next day after surgery. Shower daily.  Use your regular soap and water (not directly on the incision) and pat your incision(s) dry afterwards; don't rub.  No tub baths or submerging your body in water until cleared by your surgeon. If you have the soap that was given to you by pre-surgical testing that was used before surgery, you do not need to use it afterwards because this can irritate your incisions.    5. No sexual activity and nothing in the vagina for 8 weeks.   6. You may experience a small amount of clear drainage from your incisions, which is normal.  If the drainage persists, increases, or changes color please call the office.   7. Do not use creams, lotions, or ointments such as neosporin on your incisions after surgery until advised by your surgeon because they can cause removal of the dermabond glue on your incisions.     8. You may experience vaginal spotting after surgery or around the 6-8 week mark from surgery when the stitches at the top of the vagina begin to dissolve.  The spotting is normal but if you experience heavy bleeding, call our office.   9. Take Tylenol or ibuprofen first for pain and only use narcotic pain medication for severe pain not relieved by the Tylenol or Ibuprofen.  Monitor your Tylenol intake to a max of 4,000 mg in a 24 hour period. You can alternate these medications after surgery.   Diet: 1. Low sodium Heart Healthy Diet is recommended but you are cleared to resume your normal (before surgery) diet after your procedure.   2. It is safe to use a laxative, such as Miralax or Colace, if you have difficulty moving your bowels. You have been prescribed Sennakot-S to take at bedtime every evening after surgery to keep  bowel movements regular and to prevent constipation.     Wound Care: 1. Keep clean and dry.  Shower daily.   Reasons to call the Doctor: Fever - Oral temperature greater  than 100.4 degrees Fahrenheit Foul-smelling vaginal discharge Difficulty urinating Nausea and vomiting Increased pain at the site of the incision that is unrelieved with pain medicine. Difficulty breathing with or without chest pain New calf pain especially if only on one side Sudden, continuing increased vaginal bleeding with or without clots.   Contacts: For questions or concerns you should contact:   Dr. Jeral Pinch at [phone]   Joylene John, NP at [phone]   After Hours: call [phone] and have the GYN Oncologist paged/contacted (after 5 pm or on the weekends).   Messages sent via mychart are for non-urgent matters and are not responded to after hours so for urgent needs, please call the after hours number.

## 2021-02-07 NOTE — Patient Instructions (Addendum)
Preparing for your Surgery  Plan for surgery on February 25, 2021 with Dr. Jeral Pinch at [facility] will be scheduled for robotic assisted total laparoscopic hysterectomy (removal of the uterus and cervix), bilateral salpingo-oophorectomy (removal of both ovaries and fallopian tubes), sentinel lymph node biopsy, possible lymph node dissection, possible laparotomy (larger incision on your abdomen if needed).   Pre-operative Testing -You will receive a phone call from presurgical testing at [facility] to arrange for a pre-operative appointment and lab work.  -Bring your insurance card, copy of an advanced directive if applicable, medication list  -At that visit, you will be asked to sign a consent for a possible blood transfusion in case a transfusion becomes necessary during surgery.  The need for a blood transfusion is rare but having consent is a necessary part of your care.     -You should not be taking blood thinners or aspirin at least ten days prior to surgery unless instructed by your surgeon.  -Do not take supplements such as fish oil (omega 3), red yeast rice, turmeric before your surgery. You want to avoid medications with aspirin in them including headache powders such as BC or Goody's), Excedrin migraine.  Day Before Surgery at [place] will be asked to take in a light diet the day before surgery. You will be advised you can have clear liquids up until 3 hours before your surgery.    Eat a light diet the day before surgery.  Examples including soups, broths, toast, yogurt, mashed potatoes.  AVOID GAS PRODUCING FOODS. Things to avoid include carbonated beverages (fizzy beverages, sodas), raw fruits and raw vegetables (uncooked), or beans.   If your bowels are filled with gas, your surgeon will have difficulty visualizing your pelvic organs which increases your surgical risks.  Your role in recovery Your role is to become active as soon as directed by  your doctor, while still giving yourself time to heal.  Rest when you feel tired. You will be asked to do the following in order to speed your recovery:  - Cough and breathe deeply. This helps to clear and expand your lungs and can prevent pneumonia after surgery.  - [place]. Do mild physical activity. Walking or moving your legs help your circulation and body functions return to normal. Do not try to get up or walk alone the first time after surgery.   -If you develop swelling on one leg or the other, pain in the back of your leg, redness/warmth in one of your legs, please call the office or go to the Emergency Room to have a doppler to rule out a blood clot. For shortness of breath, chest pain-seek care in the Emergency Room as soon as possible. - Actively manage your pain. Managing your pain lets you move in comfort. We will ask you to rate your pain on a scale of zero to 10. It is your responsibility to tell your doctor or nurse where and how much you hurt so your pain can be treated.  Special Considerations -If you are diabetic, you may be placed on insulin after surgery to have closer control over your blood sugars to promote healing and recovery.  This does not mean that you will be discharged on insulin.  If applicable, your oral antidiabetics will be resumed when you are tolerating a solid diet.  -Your final pathology results from surgery should be available around one week after surgery and the results will  be relayed to you when available.  -Dr. Lahoma Crocker is the surgeon that assists your GYN Oncologist with surgery.  If you end up staying the night, the next day after your surgery you will either see Dr. Berline Lopes or Dr. Lahoma Crocker.  -FMLA forms can be faxed to [phone] and please allow 5-7 business days for completion.  Pain Management After Surgery -You have been prescribed your pain medication (tramadol) and bowel regimen medications before  surgery so that you can have these available when you are discharged from the hospital. The pain medication is for use ONLY AFTER surgery and a new prescription will not be given.   -Make sure that you have Tylenol and Ibuprofen at home to use on a regular basis after surgery for pain control. We recommend alternating the medications every hour to six hours since they work differently and are processed in the body differently for pain relief.  -Review the attached handout on narcotic use and their risks and side effects.   Bowel Regimen -You have been prescribed Sennakot-S to take nightly to prevent constipation especially if you are taking the narcotic pain medication intermittently.  It is important to prevent constipation and drink adequate amounts of liquids. You can stop taking this medication when you are not taking pain medication and you are back on your normal bowel routine.  Risks of Surgery Risks of surgery are low but include bleeding, infection, damage to surrounding structures, re-operation, blood clots, and very rarely death.   Blood Transfusion Information (For the consent to be signed before surgery)  We will be checking your blood type before surgery so in case of emergencies, we will know what type of blood you would need.                                            WHAT IS A BLOOD TRANSFUSION?  A transfusion is the replacement of blood or some of its parts. Blood is made up of multiple cells which provide different functions. Red blood cells carry oxygen and are used for blood loss replacement. White blood cells fight against infection. Platelets control bleeding. Plasma helps clot blood. Other blood products are available for specialized needs, such as hemophilia or other clotting disorders. BEFORE THE TRANSFUSION  Who gives blood for transfusions?  You may be able to donate blood to be used at a later date on yourself (autologous donation). Relatives can be asked to  donate blood. This is generally not any safer than if you have received blood from a stranger. The same precautions are taken to ensure safety when a relative's blood is donated. Healthy volunteers who are fully evaluated to make sure their blood is safe. This is blood bank blood. Transfusion therapy is the safest it has ever been in the practice of medicine. Before blood is taken from a donor, a complete history is taken to make sure that person has no history of diseases nor engages in risky social behavior (examples are intravenous drug use or sexual activity with multiple partners). The donor's travel history is screened to minimize risk of transmitting infections, such as malaria. The donated blood is tested for signs of infectious diseases, such as HIV and hepatitis. The blood is then tested to be sure it is compatible with you in order to minimize the chance of a transfusion reaction. If you or a relative  donates blood, this is often done in anticipation of surgery and is not appropriate for emergency situations. It takes many days to process the donated blood. RISKS AND COMPLICATIONS Although transfusion therapy is very safe and saves many lives, the main dangers of transfusion include:  Getting an infectious disease. Developing a transfusion reaction. This is an allergic reaction to something in the blood you were given. Every precaution is taken to prevent this. The decision to have a blood transfusion has been considered carefully by your caregiver before blood is given. Blood is not given unless the benefits outweigh the risks.  AFTER SURGERY INSTRUCTIONS  Return to work: 6 weeks if applicable  Activity: 1. Be up and out of the bed during the day.  Take a nap if needed.  You may walk up steps but be careful and use the hand rail.  Stair climbing will tire you more than you think, you may need to stop part way and rest.   2. No lifting or straining for 6 weeks over 10 pounds. No pushing,  pulling, straining for 6 weeks.  3. No driving for 1 week(s).  Do not drive if you are taking narcotic pain medicine and make sure that your reaction time has returned.   4. You can shower as soon as the next day after surgery. Shower daily.  Use your regular soap and water (not directly on the incision) and pat your incision(s) dry afterwards; don't rub.  No tub baths or submerging your body in water until cleared by your surgeon. If you have the soap that was given to you by pre-surgical testing that was used before surgery, you do not need to use it afterwards because this can irritate your incisions.   5. No sexual activity and nothing in the vagina for 8 weeks.  6. You may experience a small amount of clear drainage from your incisions, which is normal.  If the drainage persists, increases, or changes color please call the office.  7. Do not use creams, lotions, or ointments such as neosporin on your incisions after surgery until advised by your surgeon because they can cause removal of the dermabond glue on your incisions.    8. You may experience vaginal spotting after surgery or around the 6-8 week mark from surgery when the stitches at the top of the vagina begin to dissolve.  The spotting is normal but if you experience heavy bleeding, call our office.  9. Take Tylenol or ibuprofen first for pain and only use narcotic pain medication for severe pain not relieved by the Tylenol or Ibuprofen.  Monitor your Tylenol intake to a max of 4,000 mg in a 24 hour period. You can alternate these medications after surgery.  Diet: 1. Low sodium Heart Healthy Diet is recommended but you are cleared to resume your normal (before surgery) diet after your procedure.  2. It is safe to use a laxative, such as Miralax or Colace, if you have difficulty moving your bowels. You have been prescribed Sennakot-S to take at bedtime every evening after surgery to keep bowel movements regular and to prevent  constipation.    Wound Care: 1. Keep clean and dry.  Shower daily.  Reasons to call the Doctor: Fever - Oral temperature greater than 100.4 degrees Fahrenheit Foul-smelling vaginal discharge Difficulty urinating Nausea and vomiting Increased pain at the site of the incision that is unrelieved with pain medicine. Difficulty breathing with or without chest pain New calf pain especially if only on  one side Sudden, continuing increased vaginal bleeding with or without clots.   Contacts: For questions or concerns you should contact:  Dr. Jeral Pinch at [phone]  Joylene John, NP at [phone]  After Hours: call [phone] and have the GYN Oncologist paged/contacted (after 5 pm or on the weekends).  Messages sent via mychart are for non-urgent matters and are not responded to after hours so for urgent needs, please call the after hours number.

## 2021-02-07 NOTE — Progress Notes (Signed)
Patient here with her husband for new patient consultation with Dr. Berline Lopes and for a pre-operative discussion prior to her scheduled surgery on February 25, 2021. She is scheduled for robotic assisted total laparoscopic hysterectomy, bilateral salpingo-oophorectomy, sentinel lymph node biopsy, possible lymph node dissection, possible laparotomy. The surgery was discussed in detail.  See after visit summary for additional details. Visual aids used to discuss items related to surgery including sequential compression stockings, foley catheter, IV pump, multi-modal pain regimen including tylenol, photo of the surgical robot, female reproductive system to discuss surgery in detail.      Discussed post-op pain management in detail including the aspects of the enhanced recovery pathway.  Advised her that a new prescription would be sent in for tramadol and it is only to be used for after her upcoming surgery.  We discussed the use of tylenol post-op and to monitor for a maximum of 4,000 mg in a 24 hour period.  Also prescribed sennakot to be used after surgery and to hold if having loose stools.  Discussed bowel regimen in detail.     Discussed the use of heparin pre-op, SCDs, and measures to take at home to prevent DVT including frequent mobility.  Reportable signs and symptoms of DVT discussed. Post-operative instructions discussed and expectations for after surgery. Incisional care discussed as well including reportable signs and symptoms including erythema, drainage, wound separation.     10 minutes spent with the patient.  Verbalizing understanding of material discussed. No needs or concerns voiced at the end of the visit.   Advised patient and family to call for any needs.  Advised that her post-operative medications had been prescribed and could be picked up at any time.    This appointment is included in the global surgical bundle as pre-operative teaching and has no charge.

## 2021-02-07 NOTE — Progress Notes (Signed)
GYNECOLOGIC ONCOLOGY NEW PATIENT CONSULTATION   Patient Name: Whitney Gutierrez  Patient Age: 55 y.o. 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  We reviewed the diagnosis of complex atypical hyperplasia (CAH) and the treatment options, including medical management (Mirena IUD or progesterone PO) or hysterectomy.    Given her age and that she is done with childbirth and healthy, I recommend surgical management.  Patient desires to proceed with surgical management.    The patient is a suitable candidate for hysterectomy via a minimally invasive approach to surgery.  Given that she is peri versus postmenopausal, a bilateral salpingo-oophorectomy is also recommended.  We reviewed that robotic assistance would be used to complete the surgery.  We discussed that endometrial cancer is detected in about 40% of final uterine pathology specimens from patients with CAH.  We talked about 2 plans.  One would be the standard of care which would be to plan to send the uterus for intraoperative frozen pathology.  If cancer is identified at the time of surgery, additional procedures including lymph node evaluation, is recommended.  The other option would be to plan for sentinel lymph node biopsy.  We discussed that this is standard of care in the setting of a cancer diagnosis.  Because of the high risk of ultimately diagnosing a cancer on hysterectomy for complex atypical hyperplasia, I offered this lymph node sampling procedure to patients with a biopsy of CAH preoperatively.  We discussed that if she were not to map, then we would default to sending the uterus to pathology for frozen section to guide whether lymph node removal was necessary.  We reviewed the  sentinel lymph node technique. Risks and benefits of sentinel lymph node biopsy was reviewed. We reviewed the technique and ICG dye.  We discussed the plan the patient DOES NOT have an iodine allergy or known liver dysfunction. We reviewed the false negative rate (0.4%), and that 3% of patients with metastatic disease will not have it detected by SLN biopsy in endometrial cancer. A low risk of allergic reaction to the dye, <0.2% for ICG, has been reported. We also discussed that in the case of failed mapping, which occurs 40% of the time, a bilateral or unilateral lymphadenectomy will be performed at the surgeons discretion.   Potential benefits of sentinel nodes including a higher detection rate for metastasis due to ultrastaging and potential reduction in operative morbidity. However, there remains uncertainty as to the role for treatment of micrometastatic disease. Further, the benefit of operative morbidity associated with the SLN technique in endometrial cancer is not yet completely known. In other patient populations (e.g. the cervical cancer population) there has been observed reductions in morbidity with SLN biopsy compared to pelvic lymphadenectomy. Lymphedema, nerve dysfunction and lymphocysts are all potential risks with the SLN technique as with complete lymphadenectomy. Additional risks to the patient include the risk of damage to an internal organ while operating in an altered view (e.g. the black and white image of the robotic fluorescence imaging mode).   The patient was consented for a robotic assisted hysterectomy, bilateral salpingo-oophorectomy, sentinel lymph node evaluation, possible lymph node dissection, possible laparotomy. The risks of surgery were discussed in detail and she understands these to include infection; wound separation; hernia; vaginal cuff separation, injury to adjacent organs  such as bowel, bladder, blood vessels, ureters and nerves; bleeding which may require blood  transfusion; anesthesia risk; thromboembolic events; possible death; unforeseen complications; possible need for re-exploration; medical complications such as heart attack, stroke, pleural effusion and pneumonia; and, if full lymphadenectomy is performed the risk of lymphedema and lymphocyst. The patient will receive DVT and antibiotic prophylaxis as indicated. She voiced a clear understanding. She had the opportunity to ask questions. Perioperative instructions were reviewed with her. Prescriptions for post-op medications were sent to her pharmacy of choice.  We discussed the pathogenesis for endometrial precancer and cancer with regard to excess or unopposed estrogen exposure.  A copy of this note was sent to the patient's referring provider.   65 minutes of total time was spent for this patient encounter, including preparation, face-to-face counseling with the patient and coordination of care, and documentation of the encounter.   Jeral Pinch, MD  Division of Gynecologic Oncology  Department of Obstetrics and Gynecology  [facility]  ___________________________________________  Chief Complaint: Chief Complaint  Patient presents with   Complex atypical endometrial hyperplasia    History of Present Illness:  Whitney Gutierrez is a 55 y.o. y.o. female who is seen in consultation at the request of Everlene Farrier, MD for an evaluation of complex atypical hyperplasia.  Patient reports that last menses was in February 2022.  She had some minimal bleeding in July of this year although not enough to use a tampon.  She went to see her OB/GYN in September for checkup.  Given changes to her menses, hormones were ordered to see if she was menopausal.    Pelvic ultrasound was also ordered and this was performed on 10/10. It showed uterus measures 10 x 5.6 x 5 cm with slightly thickened inhomogenous myometrium, raising question about endometriosis.  Endometrial  thickness measures 1 cm.  Ovaries not well seen.  After saline sonohysterogram, 9 mm polypoid appearing mass seen within the endometrial cavity.  Endometrial curettings on 12/8 show complex atypical hyperplasia with squamous metaplasia.  There are few foci noted with back-to-back glandular crowding but not associated with stromal reaction.  Focal well differentiated adenocarcinoma cannot be ruled out, but changes noted to be predominantly complex atypical hyperplasia.  Patient denies any vaginal bleeding or discharge.  She denies any abdominal or pelvic pain.  She endorses good appetite without nausea or emesis.  She reports normal bowel and bladder function.  Has vertigo as well as whitecoat hypertension, denies any other medical issues.  Has never had abdominal surgery.  Family history is notable for father who was treated for stomach cancer 10 years ago here at [facility].  Patient lives in [place] with her husband and 6 year old son.  Her 83 year old daughter is visiting from college.  Patient works as a Microbiologist.  PAST MEDICAL HISTORY:  Past Medical History:  Diagnosis Date   Vertigo    White coat syndrome with hypertension      PAST SURGICAL HISTORY:  Past Surgical History:  Procedure Laterality Date   COLONOSCOPY     HYSTEROSCOPY N/A     OB/GYN HISTORY:  OB History  Gravida Para Term Preterm AB Living  2 2          SAB IAB Ectopic Multiple Live Births               # Outcome Date GA Lbr Len/2nd Weight Sex Delivery Anes PTL Lv  2 Para  1 Para             No LMP recorded. (Menstrual status: Irregular Periods).  Age at menarche: 13 Age at menopause: 78 Hx of HRT: denies Hx of STDs: No Last pap: 10/2020-ASCUS, high risk HPV negative. History of abnormal pap smears: Noted above  SCREENING STUDIES:  Last mammogram: 10/2020  Last colonoscopy: 09/2019  MEDICATIONS: Outpatient Encounter Medications as of 02/07/2021   Medication Sig   ibuprofen (ADVIL,MOTRIN) 200 MG tablet Take 400 mg by mouth every 6 (six) hours as needed. FOR PAIN- as needed   Multiple Vitamin (MULITIVITAMIN WITH MINERALS) TABS Take 1 tablet by mouth daily.   No facility-administered encounter medications on file as of 02/07/2021.    ALLERGIES:  No Known Allergies   FAMILY HISTORY:  Family History  Problem Relation Age of Onset   Stomach cancer Father    Colon polyps Brother    Colon cancer Neg Hx    Esophageal cancer Neg Hx    Rectal cancer Neg Hx    Breast cancer Neg Hx    Ovarian cancer Neg Hx    Endometrial cancer Neg Hx    Pancreatic cancer Neg Hx    Prostate cancer Neg Hx      SOCIAL HISTORY:  Social Connections: Not on file    REVIEW OF SYSTEMS:  Denies appetite changes, fevers, chills, fatigue, unexplained weight changes. Denies hearing loss, neck lumps or masses, mouth sores, ringing in ears or voice changes. Denies cough or wheezing.  Denies shortness of breath. Denies chest pain or palpitations. Denies leg swelling. Denies abdominal distention, pain, blood in stools, constipation, diarrhea, nausea, vomiting, or early satiety. Denies pain with intercourse, dysuria, frequency, hematuria or incontinence. Denies hot flashes, pelvic pain, vaginal bleeding or vaginal discharge.   Denies joint pain, back pain or muscle pain/cramps. Denies itching, rash, or wounds. Denies dizziness, headaches, numbness or seizures. Denies swollen lymph nodes or glands, denies easy bruising or bleeding. Denies anxiety, depression, confusion, or decreased concentration.  Physical Exam:  Vital Signs for this encounter:  Blood pressure (!) 152/88, pulse 97, temperature 98.2 F (36.8 C), temperature source Tympanic, resp. rate 18, height 5\' 6"  (1.676 m), weight 195 lb 11.2 oz (88.8 kg), SpO2 99 %. Body mass index is 31.59 kg/m. General: Alert, oriented, no acute distress.  HEENT: Normocephalic, atraumatic. Sclera anicteric.   Chest: Clear to auscultation bilaterally. No wheezes, rhonchi, or rales. Cardiovascular: Regular rate and rhythm, no murmurs, rubs, or gallops.  Abdomen: Normoactive bowel sounds. Soft, nondistended, nontender to palpation. No masses or hepatosplenomegaly appreciated. No palpable fluid wave.  Extremities: Grossly normal range of motion. Warm, well perfused. No edema bilaterally.  Skin: No rashes or lesions.  Lymphatics: No cervical, supraclavicular, or inguinal adenopathy.  GU:  Normal external female genitalia. No lesions. No discharge or bleeding.             Bladder/urethra:  No lesions or masses, well supported bladder             Vagina: Well rugated, no lesions or masses.             Cervix: Normal appearing, no lesions.             Uterus: 8-10, mobile, no parametrial involvement or nodularity.             Adnexa: No masses appreciated.  Rectal: Deferred  LABORATORY AND RADIOLOGIC DATA:  Outside medical records were reviewed to synthesize the above history, along with the history  and physical obtained during the visit.   No results found for: WBC, HGB, HCT, PLT, GLUCOSE, CHOL, TRIG, HDL, LDLDIRECT, LDLCALC, ALT, AST, NA, K, CL, CREATININE, BUN, CO2, TSH, PSA, INR, GLUF, HGBA1C, MICROALBUR

## 2021-02-12 NOTE — Progress Notes (Addendum)
COVID swab appointment: n/a  COVID Vaccine Completed: yes x4 Date COVID Vaccine completed: 04/13/19, 05/10/19 Has received booster: COVID vaccine manufacturer: Pfizer     Date of COVID positive in last 90 days: no  PCP - Everlene Farrier, MD Cardiologist - n/a  Chest x-ray - n/a EKG - n/a Stress Test - n/a ECHO - n/a Cardiac Cath - n/a Pacemaker/ICD device last checked: n/a Spinal Cord Stimulator: n/a  Sleep Study - n/a CPAP -   Fasting Blood Sugar - n/a Checks Blood Sugar _____ times a day  Blood Thinner Instructions: n/a Aspirin Instructions: Last Dose:  Activity level: Can go up a flight of stairs and perform activities of daily living without stopping and without symptoms of chest pain or shortness of breath.     Anesthesia review:   Patient denies shortness of breath, fever, cough and chest pain at PAT appointment   Patient verbalized understanding of instructions that were given to them at the PAT appointment. Patient was also instructed that they will need to review over the PAT instructions again at home before surgery.

## 2021-02-12 NOTE — Patient Instructions (Addendum)
DUE TO COVID-19 ONLY ONE VISITOR IS ALLOWED TO COME WITH YOU AND STAY IN THE WAITING ROOM ONLY DURING PRE OP AND PROCEDURE.   **NO VISITORS ARE ALLOWED IN THE SHORT STAY AREA OR RECOVERY ROOM!!**       Your procedure is scheduled on: 02/25/21   Report to [facility] Main Entrance    Report to admitting at 9:15 AM   Call this number if you have problems the morning of surgery [phone]   Eat a light diet the day before surgery. Avoid gas producing foods.   Do not eat food :After Midnight.   May have liquids until 8:30 AM day of surgery  CLEAR LIQUID DIET  Foods Allowed                                                                     Foods Excluded  Water, Black Coffee and tea (no milk or creamer)           liquids that you cannot  Plain Jell-O in any flavor  (No red)                                    see through such as: Fruit ices (not with fruit pulp)                                            milk, soups, orange juice              Iced Popsicles (No red)                                               All solid food                                   Apple juices Sports drinks like Gatorade (No red) Lightly seasoned clear broth or consume(fat free) Sugar    The day of surgery:  Drink ONE (1) Pre-Surgery Clear Ensure by 8:30 am the morning of surgery. Drink in one sitting. Do not sip.  This drink was given to you during your hospital  pre-op appointment visit. Nothing else to drink after completing the  Pre-Surgery Clear Ensure.          If you have questions, please contact your surgeons office.     Oral Hygiene is also important to reduce your risk of infection.                                    Remember - BRUSH YOUR TEETH THE MORNING OF SURGERY WITH YOUR REGULAR TOOTHPASTE  Stop all vitamins and supplements 7 days before surgery   Take these medicines the morning of surgery with A SIP OF WATER: None  You may not have any  metal on your body including hair pins, jewelry, and body piercing             Do not wear make-up, lotions, powders, perfumes, or deodorant  Do not wear nail polish including gel and S&S, artificial/acrylic nails, or any other type of covering on natural nails including finger and toenails. If you have artificial nails, gel coating, etc. that needs to be removed by a nail salon please have this removed prior to surgery or surgery may need to be canceled/ delayed if the surgeon/ anesthesia feels like they are unable to be safely monitored.   Do not shave  48 hours prior to surgery.    Do not bring valuables to the hospital. [place].    Patients discharged on the day of surgery will not be allowed to drive home.              Please read over the following fact sheets you were given: IF YOU HAVE QUESTIONS ABOUT YOUR PRE-OP INSTRUCTIONS PLEASE CALL [facility] - Preparing for Surgery Before surgery, you can play an important role.  Because skin is not sterile, your skin needs to be as free of germs as possible.  You can reduce the number of germs on your skin by washing with CHG (chlorahexidine gluconate) soap before surgery.  CHG is an antiseptic cleaner which kills germs and bonds with the skin to continue killing germs even after washing. Please DO NOT use if you have an allergy to CHG or antibacterial soaps.  If your skin becomes reddened/irritated stop using the CHG and inform your nurse when you arrive at Short Stay. Do not shave (including legs and underarms) for at least 48 hours prior to the first CHG shower.  You may shave your face/neck.  Please follow these instructions carefully:  1.  Shower with CHG Soap the night before surgery and the  morning of surgery.  2.  If you choose to wash your hair, wash your hair first as usual with your normal  shampoo.  3.  After you shampoo, rinse your hair and body thoroughly  to remove the shampoo.                             4.  Use CHG as you would any other liquid soap.  You can apply chg directly to the skin and wash.  Gently with a scrungie or clean washcloth.  5.  Apply the CHG Soap to your body ONLY FROM THE NECK DOWN.   Do   not use on face/ open                           Wound or open sores. Avoid contact with eyes, ears mouth and   genitals (private parts).                       Wash face,  Genitals (private parts) with your normal soap.             6.  Wash thoroughly, paying special attention to the area where your    surgery  will be performed.  7.  Thoroughly rinse your body with warm water from the neck down.  8.  DO NOT shower/wash with your normal soap after using and rinsing off the CHG Soap.                9.  Pat yourself dry with a clean towel.            10.  Wear clean pajamas.            11.  Place clean sheets on your bed the night of your first shower and do not  sleep with pets. Day of Surgery : Do not apply any lotions/deodorants the morning of surgery.  Please wear clean clothes to the hospital/surgery center.  FAILURE TO FOLLOW THESE INSTRUCTIONS MAY RESULT IN THE CANCELLATION OF YOUR SURGERY  PATIENT SIGNATURE_________________________________  NURSE SIGNATURE__________________________________  ________________________________________________________________________  WHAT IS A BLOOD TRANSFUSION? Blood Transfusion Information  A transfusion is the replacement of blood or some of its parts. Blood is made up of multiple cells which provide different functions. Red blood cells carry oxygen and are used for blood loss replacement. White blood cells fight against infection. Platelets control bleeding. Plasma helps clot blood. Other blood products are available for specialized needs, such as hemophilia or other clotting disorders. BEFORE THE TRANSFUSION  Who gives blood for transfusions?  Healthy volunteers who are fully evaluated to  make sure their blood is safe. This is blood bank blood. Transfusion therapy is the safest it has ever been in the practice of medicine. Before blood is taken from a donor, a complete history is taken to make sure that person has no history of diseases nor engages in risky social behavior (examples are intravenous drug use or sexual activity with multiple partners). The donor's travel history is screened to minimize risk of transmitting infections, such as malaria. The donated blood is tested for signs of infectious diseases, such as HIV and hepatitis. The blood is then tested to be sure it is compatible with you in order to minimize the chance of a transfusion reaction. If you or a relative donates blood, this is often done in anticipation of surgery and is not appropriate for emergency situations. It takes many days to process the donated blood. RISKS AND COMPLICATIONS Although transfusion therapy is very safe and saves many lives, the main dangers of transfusion include:  Getting an infectious disease. Developing a transfusion reaction. This is an allergic reaction to something in the blood you were given. Every precaution is taken to prevent this. The decision to have a blood transfusion has been considered carefully by your caregiver before blood is given. Blood is not given unless the benefits outweigh the risks. AFTER THE TRANSFUSION Right after receiving a blood transfusion, you will usually feel much better and more energetic. This is especially true if your red blood cells have gotten low (anemic). The transfusion raises the level of the red blood cells which carry oxygen, and this usually causes an energy increase. The nurse administering the transfusion will monitor you carefully for complications. HOME CARE INSTRUCTIONS  No special instructions are needed after a transfusion. You may find your energy is better. Speak with your caregiver about any limitations on activity for underlying  diseases you may have. SEEK MEDICAL CARE IF:  Your condition is not improving after your transfusion. You develop redness or irritation at the intravenous (IV) site. SEEK IMMEDIATE MEDICAL CARE IF:  Any of the following symptoms occur over the next 12 hours: Shaking chills. You have a temperature by mouth above 102 F (38.9 C), not controlled by medicine.  Chest, back, or muscle pain. People around you feel you are not acting correctly or are confused. Shortness of breath or difficulty breathing. Dizziness and fainting. You get a rash or develop hives. You have a decrease in urine output. Your urine turns a dark color or changes to pink, red, or brown. Any of the following symptoms occur over the next 10 days: You have a temperature by mouth above 102 F (38.9 C), not controlled by medicine. Shortness of breath. Weakness after normal activity. The white part of the eye turns yellow (jaundice). You have a decrease in the amount of urine or are urinating less often. Your urine turns a dark color or changes to pink, red, or brown. Document Released: 01/31/2000 Document Revised: 04/27/2011 Document Reviewed: 09/19/2007 [facility] Patient Information 2014 [place], [place].  _______________________________________________________________________

## 2021-02-13 ENCOUNTER — Encounter (HOSPITAL_COMMUNITY)
Admission: RE | Admit: 2021-02-13 | Discharge: 2021-02-13 | Disposition: A | Discharge status: Home / Self Care | Payer: BC Managed Care – PPO | Source: Ambulatory Visit | Attending: Gynecologic Oncology | Admitting: Gynecologic Oncology

## 2021-02-13 ENCOUNTER — Other Ambulatory Visit: Payer: Self-pay

## 2021-02-13 ENCOUNTER — Encounter (HOSPITAL_COMMUNITY): Payer: Self-pay

## 2021-02-13 DIAGNOSIS — Z01812 Encounter for preprocedural laboratory examination: Secondary | ICD-10-CM | POA: Diagnosis not present

## 2021-02-13 DIAGNOSIS — N8502 Endometrial intraepithelial neoplasia [EIN]: Secondary | ICD-10-CM

## 2021-02-13 LAB — COMPREHENSIVE METABOLIC PANEL
ALT: 9 U/L (ref 0–44)
AST: 17 U/L (ref 15–41)
Albumin: 3.7 g/dL (ref 3.5–5.0)
Alkaline Phosphatase: 71 U/L (ref 38–126)
Anion gap: 6 (ref 5–15)
BUN: 18 mg/dL (ref 6–20)
CO2: 26 mmol/L (ref 22–32)
Calcium: 9 mg/dL (ref 8.9–10.3)
Chloride: 106 mmol/L (ref 98–111)
Creatinine, Ser: 0.61 mg/dL (ref 0.44–1.00)
GFR, Estimated: >60 mL/min (ref >60)
Glucose, Bld: 108 mg/dL — ABNORMAL HIGH (ref 70–99)
Potassium: 4.1 mmol/L (ref 3.5–5.1)
Sodium: 138 mmol/L (ref 135–145)
Total Bilirubin: 0.4 mg/dL (ref 0.3–1.2)
Total Protein: 7 g/dL (ref 6.5–8.1)

## 2021-02-13 LAB — CBC
HCT: 43.5 % (ref 36.0–46.0)
Hemoglobin: 14 g/dL (ref 12.0–15.0)
MCH: 30.3 pg (ref 26.0–34.0)
MCHC: 32.2 g/dL (ref 30.0–36.0)
MCV: 94.2 fL (ref 80.0–100.0)
Platelets: 295 10*3/uL (ref 150–400)
RBC: 4.62 MIL/uL (ref 3.87–5.11)
RDW: 13.6 % (ref 11.5–15.5)
WBC: 7.8 10*3/uL (ref 4.0–10.5)
nRBC: 0 % (ref 0.0–0.2)

## 2021-02-13 LAB — TYPE AND SCREEN
ABO/RH(D): O POS
Antibody Screen: NEGATIVE

## 2021-02-24 ENCOUNTER — Telehealth: Payer: Self-pay

## 2021-02-24 NOTE — Telephone Encounter (Signed)
Checking in with patient pre-operatively. Unable to contact patient. Left message requesting return call.

## 2021-02-24 NOTE — Telephone Encounter (Signed)
Telephone call to check on pre-operative status.  Patient compliant with pre-operative instructions.  Reinforced nothing to eat after midnight. Clear liquids until 0830. Patient to arrive at 0915.  No questions or concerns voiced.  Instructed to call for any needs. 

## 2021-02-25 ENCOUNTER — Ambulatory Visit (HOSPITAL_COMMUNITY): Payer: BC Managed Care – PPO | Admitting: Physician Assistant

## 2021-02-25 ENCOUNTER — Ambulatory Visit (HOSPITAL_COMMUNITY): Payer: BC Managed Care – PPO | Admitting: Anesthesiology

## 2021-02-25 ENCOUNTER — Encounter (HOSPITAL_COMMUNITY): Payer: Self-pay | Admitting: Gynecologic Oncology

## 2021-02-25 ENCOUNTER — Encounter (HOSPITAL_COMMUNITY)
Admission: RE | Disposition: A | Discharge status: Home / Self Care | Payer: Self-pay | Source: Ambulatory Visit | Attending: Gynecologic Oncology

## 2021-02-25 ENCOUNTER — Ambulatory Visit (HOSPITAL_COMMUNITY)
Admission: RE | Admit: 2021-02-25 | Discharge: 2021-02-25 | Disposition: A | Discharge status: Home / Self Care | Payer: BC Managed Care – PPO | Source: Ambulatory Visit | Attending: Gynecologic Oncology | Admitting: Gynecologic Oncology

## 2021-02-25 DIAGNOSIS — N8502 Endometrial intraepithelial neoplasia [EIN]: Secondary | ICD-10-CM | POA: Insufficient documentation

## 2021-02-25 DIAGNOSIS — N8003 Adenomyosis of the uterus: Secondary | ICD-10-CM | POA: Diagnosis not present

## 2021-02-25 DIAGNOSIS — N838 Other noninflammatory disorders of ovary, fallopian tube and broad ligament: Secondary | ICD-10-CM | POA: Diagnosis not present

## 2021-02-25 DIAGNOSIS — N72 Inflammatory disease of cervix uteri: Secondary | ICD-10-CM | POA: Insufficient documentation

## 2021-02-25 DIAGNOSIS — N80202 Endometriosis of left fallopian tube, unspecified depth: Secondary | ICD-10-CM | POA: Diagnosis not present

## 2021-02-25 DIAGNOSIS — N83291 Other ovarian cyst, right side: Secondary | ICD-10-CM | POA: Insufficient documentation

## 2021-02-25 DIAGNOSIS — N8501 Benign endometrial hyperplasia: Secondary | ICD-10-CM | POA: Diagnosis present

## 2021-02-25 HISTORY — PX: SENTINEL NODE BIOPSY: SHX6608

## 2021-02-25 HISTORY — PX: ROBOTIC ASSISTED TOTAL HYSTERECTOMY WITH BILATERAL SALPINGO OOPHERECTOMY: SHX6086

## 2021-02-25 LAB — ABO/RH: ABO/RH(D): O POS

## 2021-02-25 LAB — PREGNANCY, URINE: Preg Test, Ur: NEGATIVE

## 2021-02-25 SURGERY — HYSTERECTOMY, TOTAL, ROBOT-ASSISTED, LAPAROSCOPIC, WITH BILATERAL SALPINGO-OOPHORECTOMY
Anesthesia: General

## 2021-02-25 MED ORDER — SCOPOLAMINE 1 MG/3DAYS TD PT72
1.0000 | MEDICATED_PATCH | TRANSDERMAL | Status: DC
  Administered 2021-02-25: 1.5 mg via TRANSDERMAL
  Filled 2021-02-25: qty 1

## 2021-02-25 MED ORDER — OXYCODONE HCL 5 MG PO TABS
ORAL_TABLET | ORAL | Status: AC
  Filled 2021-02-25: qty 1

## 2021-02-25 MED ORDER — CEFAZOLIN SODIUM-DEXTROSE 2-4 GM/100ML-% IV SOLN
2.0000 g | INTRAVENOUS | Status: AC
  Administered 2021-02-25: 2 g via INTRAVENOUS
  Filled 2021-02-25: qty 100

## 2021-02-25 MED ORDER — ORAL CARE MOUTH RINSE: 15.0000 mL | Freq: Once | OROMUCOSAL | Status: AC

## 2021-02-25 MED ORDER — PHENYLEPHRINE 40 MCG/ML (10ML) SYRINGE FOR IV PUSH (FOR BLOOD PRESSURE SUPPORT)
PREFILLED_SYRINGE | INTRAVENOUS | Status: DC | PRN
  Administered 2021-02-25 (×2): 80 ug via INTRAVENOUS

## 2021-02-25 MED ORDER — BUPIVACAINE HCL 0.25 % IJ SOLN
INTRAMUSCULAR | Status: AC
  Filled 2021-02-25: qty 1

## 2021-02-25 MED ORDER — KETAMINE HCL 10 MG/ML IJ SOLN
INTRAMUSCULAR | Status: DC | PRN
  Administered 2021-02-25: 30 mg via INTRAVENOUS

## 2021-02-25 MED ORDER — PHENYLEPHRINE 40 MCG/ML (10ML) SYRINGE FOR IV PUSH (FOR BLOOD PRESSURE SUPPORT)
PREFILLED_SYRINGE | INTRAVENOUS | Status: AC
  Filled 2021-02-25: qty 10

## 2021-02-25 MED ORDER — ONDANSETRON HCL 4 MG/2ML IJ SOLN
INTRAMUSCULAR | Status: AC
  Filled 2021-02-25: qty 2

## 2021-02-25 MED ORDER — LACTATED RINGERS IV SOLN: INTRAVENOUS | Status: DC

## 2021-02-25 MED ORDER — GABAPENTIN 300 MG PO CAPS
300.0000 mg | ORAL_CAPSULE | ORAL | Status: AC
  Administered 2021-02-25: 300 mg via ORAL
  Filled 2021-02-25: qty 1

## 2021-02-25 MED ORDER — KETAMINE HCL 50 MG/5ML IJ SOSY
PREFILLED_SYRINGE | INTRAMUSCULAR | Status: AC
  Filled 2021-02-25: qty 5

## 2021-02-25 MED ORDER — LIDOCAINE HCL (CARDIAC) PF 100 MG/5ML IV SOSY: PREFILLED_SYRINGE | INTRAVENOUS | Status: DC | PRN

## 2021-02-25 MED ORDER — CELECOXIB 200 MG PO CAPS
200.0000 mg | ORAL_CAPSULE | ORAL | Status: AC
  Administered 2021-02-25: 200 mg via ORAL
  Filled 2021-02-25: qty 1

## 2021-02-25 MED ORDER — FENTANYL CITRATE PF 50 MCG/ML IJ SOSY
PREFILLED_SYRINGE | INTRAMUSCULAR | Status: AC
  Filled 2021-02-25: qty 3

## 2021-02-25 MED ORDER — OXYCODONE HCL 5 MG PO TABS
5.0000 mg | ORAL_TABLET | Freq: Once | ORAL | Status: AC
Start: 2021-02-25 — End: 2021-02-25
  Administered 2021-02-25: 5 mg via ORAL

## 2021-02-25 MED ORDER — STERILE WATER FOR INJECTION IJ SOLN
INTRAMUSCULAR | Status: AC
  Filled 2021-02-25: qty 10

## 2021-02-25 MED ORDER — ROCURONIUM BROMIDE 10 MG/ML (PF) SYRINGE
PREFILLED_SYRINGE | INTRAVENOUS | Status: AC
  Filled 2021-02-25: qty 10

## 2021-02-25 MED ORDER — DEXAMETHASONE SODIUM PHOSPHATE 10 MG/ML IJ SOLN
INTRAMUSCULAR | Status: AC
  Filled 2021-02-25: qty 1

## 2021-02-25 MED ORDER — SUGAMMADEX SODIUM 200 MG/2ML IV SOLN
INTRAVENOUS | Status: DC | PRN
Start: 2021-02-25 — End: 2021-02-25
  Administered 2021-02-25: 180 mg via INTRAVENOUS

## 2021-02-25 MED ORDER — FENTANYL CITRATE PF 50 MCG/ML IJ SOSY
25.0000 ug | PREFILLED_SYRINGE | INTRAMUSCULAR | Status: DC | PRN
  Administered 2021-02-25 (×3): 50 ug via INTRAVENOUS

## 2021-02-25 MED ORDER — CHLORHEXIDINE GLUCONATE 0.12 % MT SOLN
15.0000 mL | Freq: Once | OROMUCOSAL | Status: AC
  Administered 2021-02-25: 15 mL via OROMUCOSAL

## 2021-02-25 MED ORDER — DEXAMETHASONE SODIUM PHOSPHATE 10 MG/ML IJ SOLN
INTRAMUSCULAR | Status: DC | PRN
  Administered 2021-02-25: 10 mg via INTRAVENOUS

## 2021-02-25 MED ORDER — LIDOCAINE HCL (PF) 2 % IJ SOLN: INTRAMUSCULAR | Status: DC | PRN

## 2021-02-25 MED ORDER — FENTANYL CITRATE (PF) 100 MCG/2ML IJ SOLN
INTRAMUSCULAR | Status: AC
  Filled 2021-02-25: qty 2

## 2021-02-25 MED ORDER — DEXAMETHASONE SODIUM PHOSPHATE 4 MG/ML IJ SOLN: 4.0000 mg | INTRAMUSCULAR | Status: DC

## 2021-02-25 MED ORDER — STERILE WATER FOR INJECTION IJ SOLN
INTRAMUSCULAR | Status: DC | PRN
  Administered 2021-02-25: 10 mL via INTRAMUSCULAR

## 2021-02-25 MED ORDER — ROCURONIUM BROMIDE 100 MG/10ML IV SOLN
INTRAVENOUS | Status: DC | PRN
Start: 2021-02-25 — End: 2021-02-25
  Administered 2021-02-25: 100 mg via INTRAVENOUS

## 2021-02-25 MED ORDER — LIDOCAINE HCL 2 % IJ SOLN
INTRAMUSCULAR | Status: AC
  Filled 2021-02-25: qty 20

## 2021-02-25 MED ORDER — MIDAZOLAM HCL 2 MG/2ML IJ SOLN
INTRAMUSCULAR | Status: AC
  Filled 2021-02-25: qty 2

## 2021-02-25 MED ORDER — STERILE WATER FOR INJECTION IJ SOLN
INTRAMUSCULAR | Status: DC | PRN
  Administered 2021-02-25: 10 mL

## 2021-02-25 MED ORDER — ENSURE PRE-SURGERY PO LIQD
296.0000 mL | Freq: Once | ORAL | Status: DC
  Filled 2021-02-25: qty 296

## 2021-02-25 MED ORDER — PROPOFOL 10 MG/ML IV BOLUS
INTRAVENOUS | Status: AC
  Filled 2021-02-25: qty 20

## 2021-02-25 MED ORDER — LIDOCAINE HCL (CARDIAC) PF 100 MG/5ML IV SOSY
PREFILLED_SYRINGE | INTRAVENOUS | Status: DC | PRN
  Administered 2021-02-25: 60 mg via INTRAVENOUS

## 2021-02-25 MED ORDER — MIDAZOLAM HCL 5 MG/5ML IJ SOLN
INTRAMUSCULAR | Status: DC | PRN
  Administered 2021-02-25: 2 mg via INTRAVENOUS

## 2021-02-25 MED ORDER — PROPOFOL 10 MG/ML IV BOLUS
INTRAVENOUS | Status: DC | PRN
Start: 2021-02-25 — End: 2021-02-25
  Administered 2021-02-25: 30 mg via INTRAVENOUS
  Administered 2021-02-25: 130 mg via INTRAVENOUS
  Administered 2021-02-25: 40 mg via INTRAVENOUS

## 2021-02-25 MED ORDER — STERILE WATER FOR IRRIGATION IR SOLN
Status: DC | PRN
  Administered 2021-02-25: 1000 mL

## 2021-02-25 MED ORDER — HEPARIN SODIUM (PORCINE) 5000 UNIT/ML IJ SOLN
5000.0000 [IU] | INTRAMUSCULAR | Status: AC
  Administered 2021-02-25: 5000 [IU] via SUBCUTANEOUS
  Filled 2021-02-25: qty 1

## 2021-02-25 MED ORDER — LACTATED RINGERS IR SOLN
Status: DC | PRN
  Administered 2021-02-25: 1000 mL

## 2021-02-25 MED ORDER — FENTANYL CITRATE (PF) 100 MCG/2ML IJ SOLN
INTRAMUSCULAR | Status: DC | PRN
  Administered 2021-02-25: 100 ug via INTRAVENOUS

## 2021-02-25 MED ORDER — LIDOCAINE HCL (PF) 2 % IJ SOLN
INTRAMUSCULAR | Status: AC
  Filled 2021-02-25: qty 5

## 2021-02-25 MED ORDER — BUPIVACAINE HCL 0.25 % IJ SOLN
INTRAMUSCULAR | Status: DC | PRN
  Administered 2021-02-25: 35 mL

## 2021-02-25 MED ORDER — ACETAMINOPHEN 500 MG PO TABS
1000.0000 mg | ORAL_TABLET | ORAL | Status: AC
  Administered 2021-02-25: 1000 mg via ORAL
  Filled 2021-02-25: qty 2

## 2021-02-25 MED ORDER — LIDOCAINE HCL (PF) 2 % IJ SOLN
INTRAMUSCULAR | Status: DC | PRN
  Administered 2021-02-25: 1.5 mg/kg/h via INTRADERMAL

## 2021-02-25 SURGICAL SUPPLY — 68 items
APPLICATOR SURGIFLO ENDO (HEMOSTASIS) IMPLANT
BAG COUNTER SPONGE SURGICOUNT (BAG) IMPLANT
BAG LAPAROSCOPIC 12 15 PORT 16 (BASKET) IMPLANT
BAG RETRIEVAL 12/15 (BASKET) ×3
BLADE SURG SZ10 CARB STEEL (BLADE) IMPLANT
COVER BACK TABLE 60X90IN (DRAPES) ×3 IMPLANT
COVER TIP SHEARS 8 DVNC (MISCELLANEOUS) ×2 IMPLANT
COVER TIP SHEARS 8MM DA VINCI (MISCELLANEOUS) ×3
DECANTER SPIKE VIAL GLASS SM (MISCELLANEOUS) ×3 IMPLANT
DERMABOND ADVANCED (GAUZE/BANDAGES/DRESSINGS) ×1
DERMABOND ADVANCED .7 DNX12 (GAUZE/BANDAGES/DRESSINGS) ×2 IMPLANT
DRAPE ARM DVNC X/XI (DISPOSABLE) ×8 IMPLANT
DRAPE COLUMN DVNC XI (DISPOSABLE) ×2 IMPLANT
DRAPE DA VINCI XI ARM (DISPOSABLE) ×12
DRAPE DA VINCI XI COLUMN (DISPOSABLE) ×3
DRAPE SHEET LG 3/4 BI-LAMINATE (DRAPES) ×3 IMPLANT
DRAPE SURG IRRIG POUCH 19X23 (DRAPES) ×3 IMPLANT
DRSG OPSITE POSTOP 4X6 (GAUZE/BANDAGES/DRESSINGS) IMPLANT
DRSG OPSITE POSTOP 4X8 (GAUZE/BANDAGES/DRESSINGS) IMPLANT
ELECT PENCIL ROCKER SW 15FT (MISCELLANEOUS) ×1 IMPLANT
ELECT REM PT RETURN 15FT ADLT (MISCELLANEOUS) ×3 IMPLANT
GAUZE 4X4 16PLY ~~LOC~~+RFID DBL (SPONGE) ×3 IMPLANT
GLOVE SURG ENC MOIS LTX SZ6 (GLOVE) ×12 IMPLANT
GLOVE SURG ENC MOIS LTX SZ6.5 (GLOVE) ×6 IMPLANT
GOWN STRL REUS W/ TWL LRG LVL3 (GOWN DISPOSABLE) ×8 IMPLANT
GOWN STRL REUS W/TWL LRG LVL3 (GOWN DISPOSABLE) ×12
HOLDER FOLEY CATH W/STRAP (MISCELLANEOUS) IMPLANT
IRRIG SUCT STRYKERFLOW 2 WTIP (MISCELLANEOUS) ×3
IRRIGATION SUCT STRKRFLW 2 WTP (MISCELLANEOUS) ×2 IMPLANT
KIT PROCEDURE DA VINCI SI (MISCELLANEOUS) ×3
KIT PROCEDURE DVNC SI (MISCELLANEOUS) IMPLANT
KIT TURNOVER KIT A (KITS) IMPLANT
MANIPULATOR ADVINCU DEL 3.0 PL (MISCELLANEOUS) IMPLANT
MANIPULATOR ADVINCU DEL 3.5 PL (MISCELLANEOUS) IMPLANT
MANIPULATOR UTERINE 4.5 ZUMI (MISCELLANEOUS) ×1 IMPLANT
NDL HYPO 21X1.5 SAFETY (NEEDLE) ×2 IMPLANT
NDL SPNL 18GX3.5 QUINCKE PK (NEEDLE) IMPLANT
NEEDLE HYPO 21X1.5 SAFETY (NEEDLE) ×3 IMPLANT
NEEDLE SPNL 18GX3.5 QUINCKE PK (NEEDLE) ×3 IMPLANT
OBTURATOR OPTICAL STANDARD 8MM (TROCAR) ×3
OBTURATOR OPTICAL STND 8 DVNC (TROCAR) ×2
OBTURATOR OPTICALSTD 8 DVNC (TROCAR) ×2 IMPLANT
PACK ROBOT GYN CUSTOM WL (TRAY / TRAY PROCEDURE) ×3 IMPLANT
PAD POSITIONING PINK XL (MISCELLANEOUS) ×3 IMPLANT
PORT ACCESS TROCAR AIRSEAL 12 (TROCAR) ×2 IMPLANT
PORT ACCESS TROCAR AIRSEAL 5M (TROCAR) ×1
POUCH SPECIMEN RETRIEVAL 10MM (ENDOMECHANICALS) IMPLANT
SCRUB EXIDINE 4% CHG 4OZ (MISCELLANEOUS) ×3 IMPLANT
SEAL CANN UNIV 5-8 DVNC XI (MISCELLANEOUS) ×8 IMPLANT
SEAL XI 5MM-8MM UNIVERSAL (MISCELLANEOUS) ×12
SET TRI-LUMEN FLTR TB AIRSEAL (TUBING) ×3 IMPLANT
SPONGE T-LAP 18X18 ~~LOC~~+RFID (SPONGE) IMPLANT
SURGIFLO W/THROMBIN 8M KIT (HEMOSTASIS) IMPLANT
SUT MNCRL AB 4-0 PS2 18 (SUTURE) IMPLANT
SUT PDS AB 1 TP1 96 (SUTURE) IMPLANT
SUT VIC AB 0 CT1 27 (SUTURE)
SUT VIC AB 0 CT1 27XBRD ANTBC (SUTURE) IMPLANT
SUT VIC AB 2-0 CT1 27 (SUTURE)
SUT VIC AB 2-0 CT1 TAPERPNT 27 (SUTURE) IMPLANT
SUT VIC AB 4-0 PS2 18 (SUTURE) ×6 IMPLANT
SYR 10ML LL (SYRINGE) IMPLANT
TOWEL OR NON WOVEN STRL DISP B (DISPOSABLE) IMPLANT
TRAP SPECIMEN MUCUS 40CC (MISCELLANEOUS) IMPLANT
TRAY FOLEY MTR SLVR 16FR STAT (SET/KITS/TRAYS/PACK) ×3 IMPLANT
TROCAR XCEL NON-BLD 5MMX100MML (ENDOMECHANICALS) IMPLANT
UNDERPAD 30X36 HEAVY ABSORB (UNDERPADS AND DIAPERS) ×6 IMPLANT
WATER STERILE IRR 1000ML POUR (IV SOLUTION) ×3 IMPLANT
YANKAUER SUCT BULB TIP 10FT TU (MISCELLANEOUS) IMPLANT

## 2021-02-25 NOTE — Interval H&P Note (Signed)
History and Physical Interval Note:  02/25/2021 10:40 AM  Whitney Gutierrez  has presented today for surgery, with the diagnosis of [place].  The various methods of treatment have been discussed with the patient and family. After consideration of risks, benefits and other options for treatment, the patient has consented to  Procedure(s): XI ROBOTIC ASSISTED TOTAL HYSTERECTOMY WITH BILATERAL SALPINGO OOPHORECTOMY;POSSIBLE LAPAROTOMY (Bilateral) SENTINEL NODE BIOPSY (N/A) LYMPH NODE DISSECTION (N/A) as a surgical intervention.  The patient's history has been reviewed, patient examined, no change in status, stable for surgery.  I have reviewed the patient's chart and labs.  Questions were answered to the patient's satisfaction.     Lafonda Mosses

## 2021-02-25 NOTE — Anesthesia Postprocedure Evaluation (Signed)
Anesthesia Post Note  Patient: Aury Wilkins-Little  Procedure(s) Performed: XI ROBOTIC ASSISTED TOTAL HYSTERECTOMY WITH BILATERAL SALPINGO OOPHORECTOMY (Bilateral) SENTINEL NODE BIOPSY     Patient location during evaluation: PACU Anesthesia Type: General Level of consciousness: awake and alert Pain management: pain level controlled Vital Signs Assessment: post-procedure vital signs reviewed and stable Respiratory status: spontaneous breathing, nonlabored ventilation, respiratory function stable and patient connected to nasal cannula oxygen Cardiovascular status: blood pressure returned to baseline and stable Postop Assessment: no apparent nausea or vomiting Anesthetic complications: no   No notable events documented.  Last Vitals:  Vitals:   02/25/21 1515 02/25/21 1530  BP: 137/73 (!) 141/72  Pulse: 74 75  Resp: 14 14  Temp:    SpO2: 98% 98%    Last Pain:  Vitals:   02/25/21 1559  TempSrc:   PainSc: 4                  Ivee Poellnitz L Hulbert Branscome

## 2021-02-25 NOTE — Discharge Instructions (Addendum)
02/25/2021  Return to work: 4-6  Activity: 1. Be up and out of the bed during the day.  Take a nap if needed.  You may walk up steps but be careful and use the hand rail.  Stair climbing will tire you more than you think, you may need to stop part way and rest.   2. No lifting or straining for 6 weeks.  3. No driving until you are off narcotics and can brake safely. For most patients, this is 1-2 weeks.  Do Not drive if you are taking narcotic pain medicine.  4. Shower daily.  Use soap and water on your incision and pat dry; don't rub.   5. No sexual activity and nothing in the vagina for 8+ weeks.  Medications:  - Take ibuprofen and tylenol first line for pain control. Take these regularly (every 6 hours) to decrease the build up of pain.  - If necessary, for severe pain not relieved by ibuprofen, take tramadol.  - While taking tramadol you should take sennakot every night to reduce the likelihood of constipation. If this causes diarrhea, stop its use.  Diet: 1. Low sodium Heart Healthy Diet is recommended.  2. It is safe to use a laxative if you have difficulty moving your bowels.   Wound Care: 1. Keep clean and dry.  Shower daily.  Reasons to call the Doctor:  Fever - Oral temperature greater than 100.4 degrees Fahrenheit Foul-smelling vaginal discharge Difficulty urinating Nausea and vomiting Increased pain at the site of the incision that is unrelieved with pain medicine. Difficulty breathing with or without chest pain New calf pain especially if only on one side Sudden, continuing increased vaginal bleeding with or without clots.   Follow-up: 1. Phone call with Dr. Berline Lopes on 1/17, in person clinic visit in approximately 3 weeks as scheduled.  Contacts: For questions or concerns you should contact:  Dr. Jeral Pinch at [phone] After hours and on week-ends call [phone] and ask to speak to the physician on call for Gynecologic Oncology

## 2021-02-25 NOTE — Anesthesia Procedure Notes (Signed)
Procedure Name: Intubation Date/Time: 02/25/2021 11:52 AM Performed by: Josephine Igo, CRNA Pre-anesthesia Checklist: Patient identified, Suction available, Emergency Drugs available and Patient being monitored Patient Re-evaluated:Patient Re-evaluated prior to induction Oxygen Delivery Method: Circle system utilized Preoxygenation: Pre-oxygenation with 100% oxygen Induction Type: IV induction and Cricoid Pressure applied Ventilation: Mask ventilation without difficulty and Oral airway inserted - appropriate to patient size Laryngoscope Size: Glidescope and 3 Tube type: Oral Tube size: 7.0 mm Number of attempts: 2 Airway Equipment and Method: Video-laryngoscopy Placement Confirmation: ETT inserted through vocal cords under direct vision and positive ETCO2 Secured at: 22 cm Tube secured with: Tape Dental Injury: Teeth and Oropharynx as per pre-operative assessment  Comments: DL with Interior and spatial designer; esophageal intubation -recognized immediately. GlideScope intubation without difficulty-Vonita Calloway CRNA

## 2021-02-25 NOTE — Op Note (Signed)
OPERATIVE NOTE  Pre-operative Diagnosis: CAH  Post-operative Diagnosis: same, suspected endometriosis  Operation: Robotic-assisted laparoscopic total hysterectomy with bilateral salpingoophorectomy, SLN biopsy   Surgeon: Jeral Pinch MD  Assistant Surgeon: Lahoma Crocker MD (an MD assistant was necessary for tissue manipulation, management of robotic instrumentation, retraction and positioning due to the complexity of the case and hospital policies).   Anesthesia: GET  Urine Output: 300cc  Operative Findings: On EUA, 8-10cm mobile uterus. Normal upper abdominal survey. Normal omentum, small and large bowel. Uterus 10cm. Normal appearing bilateral adnexa. Some fibrosis just lateral to the left fallopian tube. Minimal adhesions between the bladder and cervix. Several powder burn lesions noted within the pelvis, one with drainage of small chocolate brown fluid. Mapping successful to bilateral external iliac SLNs.  Estimated Blood Loss:  75cc      Total IV Fluids: see I&O flowsheet         Specimens: uterus, cervix, bilateral tubes and ovaries, bilateral external iliac SLNs         Complications:  None apparent; patient tolerated the procedure well.         Disposition: PACU - hemodynamically stable.  Procedure Details  The patient was seen in the Holding Room. The risks, benefits, complications, treatment options, and expected outcomes were discussed with the patient.  The patient concurred with the proposed plan, giving informed consent.  The site of surgery properly noted/marked. The patient was identified as Whitney Gutierrez and the procedure verified as a Robotic-assisted hysterectomy with bilateral salpingo oophorectomy with SLN biopsy.   After induction of anesthesia, the patient was draped and prepped in the usual sterile manner. Patient was placed in supine position after anesthesia and draped and prepped in the usual sterile manner as follows: Her arms were tucked to  her side with all appropriate precautions.  The shoulders were stabilized with padded shoulder blocks applied to the acromium processes.  The patient was placed in the semi-lithotomy position in [place].  The perineum and vagina were prepped with CholoraPrep. The patient was draped after the CholoraPrep had been allowed to dry for 3 minutes.  A Time Out was held and the above information confirmed.  The urethra was prepped with Betadine. Foley catheter was placed.  A sterile speculum was placed in the vagina.  The cervix was grasped with a single-tooth tenaculum. 2mg  total of ICG was injected into the cervical stroma at 2 and 9 o'clock with 1cc injected at a 1cm and 28mm depth (concentration 0.5mg /ml) in all locations. The cervix was dilated with Kennon Rounds dilators.  The ZUMI uterine manipulator with a medium colpotomizer ring was placed without difficulty.  A pneum occluder balloon was placed over the manipulator.  OG tube placement was confirmed and to suction.   Next, a 10 mm skin incision was made 1 cm below the subcostal margin in the midclavicular line.  The 5 mm Optiview port and scope was used for direct entry.  Opening pressure was under 10 mm CO2.  The abdomen was insufflated and the findings were noted as above.   At this point and all points during the procedure, the patient's intra-abdominal pressure did not exceed 15 mmHg. Next, an 8 mm skin incision was made superior to the umbilicus and a right and left port were placed about 8 cm lateral to the robot port on the right and left side.  A fourth arm was placed on the right.  The 5 mm assist trocar was exchanged for a 10-12 mm port.  All ports were placed under direct visualization.  The patient was placed in steep Trendelenburg.  Bowel was folded away into the upper abdomen.  The robot was docked in the normal manner.  The right and left peritoneum were opened parallel to the IP ligament to open the retroperitoneal spaces bilaterally. The round  ligaments were transected. The SLN mapping was performed in bilateral pelvic basins. After identifying the ureters, the para rectal and paravesical spaces were opened up entirely with careful dissection below the level of the ureters bilaterally and to the depth of the uterine artery origin in order to skeletonize the uterine "web" and ensure visualization of all parametrial channels. The para-aortic basins were carefully exposed and evaluated for isolated para-aortic SLN's. Lymphatic channels were identified travelling to the following visualized sentinel lymph node's: bilateral external iliac SLNs. These SLN's were separated from their surrounding lymphatic tissue, removed and sent for permanent pathology.  The hysterectomy was started.  The ureter was again noted to be on the medial leaf of the broad ligament.  The peritoneum above the ureter was incised and stretched and the infundibulopelvic ligament was skeletonized, cauterized and cut.  The posterior peritoneum was taken down to the level of the KOH ring.  The anterior peritoneum was also taken down.  The bladder flap was created to the level of the KOH ring.  The uterine artery on the right side was skeletonized, cauterized and cut in the normal manner.  A similar procedure was performed on the left.  The colpotomy was made and the uterus, cervix, bilateral ovaries and tubes were amputated and delivered through the vagina.  Pedicles were inspected and excellent hemostasis was achieved.    The colpotomy at the vaginal cuff was closed with Vicryl on a CT1 needle in running manner.  Irrigation was used and excellent hemostasis was achieved.  At this point in the procedure was completed.  Robotic instruments were removed under direct visulaization.  The robot was undocked. The fascia at the 10-12 mm port was closed with 0 Vicryl on a UR-5 needle.  The subcuticular tissue was closed with 4-0 Vicryl and the skin was closed with 4-0 Monocryl in a subcuticular  manner.  Dermabond was applied.    The vagina was swabbed with  minimal bleeding noted. Foley catheter was removed.  All sponge, lap and needle counts were correct x  3.   The patient was transferred to the recovery room in stable condition.  Jeral Pinch, MD

## 2021-02-25 NOTE — Anesthesia Preprocedure Evaluation (Addendum)
Anesthesia Evaluation  Patient identified by MRN, date of birth, ID band Patient awake    Reviewed: Allergy & Precautions, NPO status , Patient's Chart, lab work & pertinent test results  Airway Mallampati: I  TM Distance: >3 FB Neck ROM: Full    Dental no notable dental hx. (+) Teeth Intact, Dental Advisory Given   Pulmonary neg pulmonary ROS,    Pulmonary exam normal breath sounds clear to auscultation       Cardiovascular hypertension, Normal cardiovascular exam Rhythm:Regular Rate:Normal     Neuro/Psych negative neurological ROS  negative psych ROS   GI/Hepatic negative GI ROS, Neg liver ROS,   Endo/Other  negative endocrine ROSObese BMI 32  Renal/GU negative Renal ROS  negative genitourinary   Musculoskeletal negative musculoskeletal ROS (+)   Abdominal   Peds  Hematology negative hematology ROS (+)   Anesthesia Other Findings   Reproductive/Obstetrics                           Anesthesia Physical Anesthesia Plan  ASA: 2  Anesthesia Plan: General   Post-op Pain Management: Tylenol PO (pre-op) and Ketamine IV   Induction: Intravenous  PONV Risk Score and Plan: 3 and Midazolam, Dexamethasone, Ondansetron and Scopolamine patch - Pre-op  Airway Management Planned: Oral ETT  Additional Equipment:   Intra-op Plan:   Post-operative Plan: Extubation in OR  Informed Consent: I have reviewed the patients History and Physical, chart, labs and discussed the procedure including the risks, benefits and alternatives for the proposed anesthesia with the patient or authorized representative who has indicated his/her understanding and acceptance.     Dental advisory given  Plan Discussed with: CRNA  Anesthesia Plan Comments: (2 IVs)       Anesthesia Quick Evaluation

## 2021-02-25 NOTE — Transfer of Care (Signed)
Immediate Anesthesia Transfer of Care Note  Patient: Whitney Gutierrez  Procedure(s) Performed: XI ROBOTIC ASSISTED TOTAL HYSTERECTOMY WITH BILATERAL SALPINGO OOPHORECTOMY (Bilateral) SENTINEL NODE BIOPSY  Patient Location: PACU  Anesthesia Type:General  Level of Consciousness: drowsy and patient cooperative  Airway & Oxygen Therapy: Patient Spontanous Breathing and Patient connected to face mask oxygen  Post-op Assessment: Report given to RN and Post -op Vital signs reviewed and stable  Post vital signs: Reviewed and stable  Last Vitals:  Vitals Value Taken Time  BP 120/65 02/25/21 1351  Temp 36.7 C 02/25/21 1351  Pulse 70 02/25/21 1353  Resp 15 02/25/21 1353  SpO2 100 % 02/25/21 1353  Vitals shown include unvalidated device data.  Last Pain:  Vitals:   02/25/21 1351  TempSrc:   PainSc: 0-No pain         Complications: No notable events documented.

## 2021-02-26 ENCOUNTER — Telehealth: Payer: Self-pay

## 2021-02-26 ENCOUNTER — Encounter (HOSPITAL_COMMUNITY): Payer: Self-pay | Admitting: Gynecologic Oncology

## 2021-02-26 NOTE — Telephone Encounter (Signed)
Spoke with Ms. Harkey this morning. She states she is eating, drinking and urinating well. She had a very loose stool after taking senokot. Advised patient she can hold the senokot for loose stools. Encouraged her to drink plenty of water. She denies fever or chills. Incisions are dry and intact. She rates her pain 4/10. Her pain is controlled with Ibuprofen. She has not needed to take tylenol or tramadol.     Instructed to call office with any fever, chills, purulent drainage, uncontrolled pain or any other questions or concerns. Patient verbalizes understanding.   Pt aware of post op appointments as well as the office number [phone] and after hours number [phone] to call if she has any questions or concerns

## 2021-02-26 NOTE — Telephone Encounter (Signed)
Attempted to follow up with patient post-operatively. Unable to contact patient. Left message requesting return call.

## 2021-02-27 ENCOUNTER — Telehealth: Payer: Self-pay | Admitting: Gynecologic Oncology

## 2021-02-27 LAB — SURGICAL PATHOLOGY

## 2021-02-27 NOTE — Telephone Encounter (Signed)
Called patient, discussed final pathology results. She's very happy with news.  Doing well from a post-op standpoint. Discussed continued expectations.  Jeral Pinch MD Gynecologic Oncology

## 2021-03-04 ENCOUNTER — Inpatient Hospital Stay: Payer: BC Managed Care – PPO | Admitting: Gynecologic Oncology

## 2021-03-04 ENCOUNTER — Telehealth: Payer: Self-pay

## 2021-03-04 NOTE — Telephone Encounter (Signed)
Patient called and was concerned about small drainage from the umbilicus incision from her surgery on 02/25/2021. She said that it was a tiny amount of blood on her pajamas.  Patient denies any fever, redness, warm to the touch and odor. All incisions are intact. Notified Melissa, NP and she said to use a gauze over the incision if her pants are rubbing it.  Patient verbalized understanding and was told to call with any concerns.

## 2021-03-12 ENCOUNTER — Encounter: Payer: Self-pay | Admitting: Gynecologic Oncology

## 2021-03-17 ENCOUNTER — Other Ambulatory Visit: Payer: Self-pay

## 2021-03-17 ENCOUNTER — Inpatient Hospital Stay: Payer: BC Managed Care – PPO | Attending: Gynecologic Oncology | Admitting: Gynecologic Oncology

## 2021-03-17 ENCOUNTER — Encounter: Payer: Self-pay | Admitting: Gynecologic Oncology

## 2021-03-17 VITALS — BP 133/83 | HR 94 | Temp 98.7°F | Resp 18 | Ht 65.0 in | Wt 196.0 lb

## 2021-03-17 DIAGNOSIS — Z90710 Acquired absence of both cervix and uterus: Secondary | ICD-10-CM

## 2021-03-17 DIAGNOSIS — Z90722 Acquired absence of ovaries, bilateral: Secondary | ICD-10-CM

## 2021-03-17 DIAGNOSIS — N8502 Endometrial intraepithelial neoplasia [EIN]: Secondary | ICD-10-CM

## 2021-03-17 DIAGNOSIS — R232 Flushing: Secondary | ICD-10-CM

## 2021-03-17 NOTE — Progress Notes (Signed)
Gynecologic Oncology Return Clinic Visit  03/17/2021  Reason for Visit: Follow-up after surgery, treatment discussion  Treatment History: 02/25/2021: Robotic total hysterectomy, BSO, sentinel lymph node biopsy  Interval History: Patient reports doing well since surgery.  Denies any vaginal bleeding or discharge.  Denies any significant abdominal or pelvic pain.  Reports good appetite without nausea or emesis.  Reports baseline bowel function.  Bladder function has improved since prior to surgery.  Feels like she can empty her bladder completely now.  Past Medical/Surgical History: Past Medical History:  Diagnosis Date   Vertigo    White coat syndrome with hypertension     Past Surgical History:  Procedure Laterality Date   COLONOSCOPY     HYSTEROSCOPY N/A    ROBOTIC ASSISTED TOTAL HYSTERECTOMY WITH BILATERAL SALPINGO OOPHERECTOMY Bilateral 02/25/2021   Procedure: XI ROBOTIC ASSISTED TOTAL HYSTERECTOMY WITH BILATERAL SALPINGO OOPHORECTOMY;  Surgeon: Lafonda Mosses, MD;  Location: [facility];  Service: Gynecology;  Laterality: Bilateral;   SENTINEL NODE BIOPSY N/A 02/25/2021   Procedure: SENTINEL NODE BIOPSY;  Surgeon: Lafonda Mosses, MD;  Location: [facility];  Service: Gynecology;  Laterality: N/A;    Family History  Problem Relation Age of Onset   Stomach cancer Father    Colon polyps Brother    Colon cancer Neg Hx    Esophageal cancer Neg Hx    Rectal cancer Neg Hx    Breast cancer Neg Hx    Ovarian cancer Neg Hx    Endometrial cancer Neg Hx    Pancreatic cancer Neg Hx    Prostate cancer Neg Hx     Social History   Socioeconomic History   Marital status: Married    Spouse name: Not on file   Number of children: Not on file   Years of education: Not on file   Highest education level: Not on file  Occupational History   Not on file  Tobacco Use   Smoking status: Never   Smokeless tobacco: Never  Vaping Use   Vaping Use: Never used  Substance and Sexual Activity    Alcohol use: No   Drug use: No   Sexual activity: Yes  Other Topics Concern   Not on file  Social History Narrative   Not on file   Social Determinants of Health   Financial Resource Strain: Not on file  Food Insecurity: Not on file  Transportation Needs: Not on file  Physical Activity: Not on file  Stress: Not on file  Social Connections: Not on file    Current Medications:  Current Outpatient Medications:    Multiple Vitamin (MULITIVITAMIN WITH MINERALS) TABS, Take 1 tablet by mouth daily., Disp: , Rfl:    ibuprofen (ADVIL) 800 MG tablet, Take 1 tablet (800 mg total) by mouth every 8 (eight) hours as needed for moderate pain. For AFTER surgery only (Patient not taking: Reported on 03/12/2021), Disp: 30 tablet, Rfl: 0   senna-docusate (SENOKOT-S) 8.6-50 MG tablet, Take 2 tablets by mouth at bedtime. For AFTER surgery, do not take if having diarrhea (Patient not taking: Reported on 03/12/2021), Disp: 30 tablet, Rfl: 0   traMADol (ULTRAM) 50 MG tablet, Take 1 tablet (50 mg total) by mouth every 6 (six) hours as needed for severe pain. For AFTER surgery only, do not take and drive (Patient not taking: Reported on 03/12/2021), Disp: 10 tablet, Rfl: 0  Review of Systems: + back pain, hot flashes Denies appetite changes, fevers, chills, fatigue, unexplained weight changes. Denies hearing loss, neck lumps  or masses, mouth sores, ringing in ears or voice changes. Denies cough or wheezing.  Denies shortness of breath. Denies chest pain or palpitations. Denies leg swelling. Denies abdominal distention, pain, blood in stools, constipation, diarrhea, nausea, vomiting, or early satiety. Denies pain with intercourse, dysuria, frequency, hematuria or incontinence. Denies pelvic pain, vaginal bleeding or vaginal discharge.   Denies joint pain or muscle pain/cramps. Denies itching, rash, or wounds. Denies dizziness, headaches, numbness or seizures. Denies swollen lymph nodes or glands, denies  easy bruising or bleeding. Denies anxiety, depression, confusion, or decreased concentration.  Physical Exam: BP 133/83 (BP Location: Left Arm, Patient Position: Sitting)    Pulse 94    Temp 98.7 F (37.1 C) (Oral)    Resp 18    Ht 5\' 5"  (1.651 m)    Wt 196 lb (88.9 kg)    SpO2 98%    BMI 32.62 kg/m  General: Alert, oriented, no acute distress. HEENT: Normocephalic, atraumatic, sclera anicteric. Chest: Unlabored breathing on room air. Abdomen: soft, nontender.  Normoactive bowel sounds.  No masses or hepatosplenomegaly appreciated.  Well-healed laparoscopic incisions, remaining Dermabond removed.  No erythema, induration, or exudate. Extremities: Grossly normal range of motion.  Warm, well perfused.  No edema bilaterally. Skin: No rashes or lesions noted. GU: Normal appearing external genitalia without erythema, excoriation, or lesions.  Speculum exam reveals well-healing cuff, suture visible, no blood or discharge within the vaginal vault.  Bimanual exam reveals cuff intact, no tenderness on palpation or fluctuance.    Laboratory & Radiologic Studies: A. LYMPH NODE, SENTINEL, RIGHT EXTERNAL ILIAC, EXCISION:  One benign lymph node, negative for carcinoma (0/1)   B. LYMPH NODE, SENTINEL, LEFT EXTERNAL ILIAC, EXCISION:  One benign lymph node, negative for carcinoma (0/1)   C. 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  The patient is doing very well from a postoperative standpoint and meeting milestones.  Discussed continued expectations as  well as postoperative restrictions.  Viewed pathology report in detail again.  Patient was given a copy of her pathology.  In terms of her hot flashes, we discussed that even in patients who are menopausal, sometimes removal of the ovaries at the time of hysterectomy can cause development or increase in menopausal symptoms such as hot flashes.  She is interested in trying an over-the-counter medication that some of her friends of told her about.  I have asked her to call me if she becomes more symptomatic in the next couple of weeks.  We briefly discussed the use of prescription medications including Effexor and hormone replacement therapy if her hot flashes were to worsen and become more symptomatic.  29 minutes of total time was spent for this patient encounter, including preparation, face-to-face counseling with the patient and coordination of care, and documentation of the encounter.  Jeral Pinch, MD  Division of Gynecologic Oncology  Department of Obstetrics and Gynecology  [facility]

## 2021-03-17 NOTE — Patient Instructions (Signed)
You are healing very well from surgery!  Remember, no heavy lifting until 6 weeks after surgery and nothing in the vagina for at least 8 weeks.  I am releasing you back to your OB/GYN for further care.  Please not hesitate to reach out if you need anything.  Depending on how the next couple of weeks ago, please reach out if you feel like hot flashes are becoming worse or you are interested in discussing treatment.  Today we talked about 2 options for treating hot flashes including Effexor or and hormone replacement.

## 2021-11-18 ENCOUNTER — Other Ambulatory Visit: Payer: Self-pay | Admitting: Obstetrics and Gynecology

## 2021-11-18 DIAGNOSIS — R928 Other abnormal and inconclusive findings on diagnostic imaging of breast: Secondary | ICD-10-CM

## 2021-11-27 ENCOUNTER — Other Ambulatory Visit: Payer: Self-pay | Admitting: Obstetrics and Gynecology

## 2021-11-27 ENCOUNTER — Ambulatory Visit
Admission: RE | Admit: 2021-11-27 | Discharge: 2021-11-27 | Disposition: A | Discharge status: Home / Self Care | Payer: BC Managed Care – PPO | Source: Ambulatory Visit | Attending: Obstetrics and Gynecology | Admitting: Obstetrics and Gynecology

## 2021-11-27 DIAGNOSIS — R928 Other abnormal and inconclusive findings on diagnostic imaging of breast: Secondary | ICD-10-CM

## 2021-11-27 DIAGNOSIS — N6310 Unspecified lump in the right breast, unspecified quadrant: Secondary | ICD-10-CM

## 2022-06-05 ENCOUNTER — Ambulatory Visit
Admission: RE | Admit: 2022-06-05 | Discharge: 2022-06-05 | Disposition: A | Discharge status: Home / Self Care | Payer: BC Managed Care – PPO | Source: Ambulatory Visit | Attending: Obstetrics and Gynecology | Admitting: Obstetrics and Gynecology

## 2022-06-05 ENCOUNTER — Other Ambulatory Visit: Payer: Self-pay | Admitting: Obstetrics and Gynecology

## 2022-06-05 DIAGNOSIS — N6310 Unspecified lump in the right breast, unspecified quadrant: Secondary | ICD-10-CM

## 2022-12-11 ENCOUNTER — Ambulatory Visit
Admission: RE | Admit: 2022-12-11 | Discharge: 2022-12-11 | Disposition: A | Discharge status: Home / Self Care | Payer: BC Managed Care – PPO | Source: Ambulatory Visit | Attending: Obstetrics and Gynecology | Admitting: Obstetrics and Gynecology

## 2022-12-11 DIAGNOSIS — N6310 Unspecified lump in the right breast, unspecified quadrant: Secondary | ICD-10-CM
